# Patient Record
Sex: Female | Born: 1975 | Race: White | Hispanic: No | Marital: Married | Smoking: Never smoker
Health system: Southern US, Community
[De-identification: ages and names within clinical notes are randomized; demographics above are authoritative.]

## PROBLEM LIST (undated history)

## (undated) DIAGNOSIS — D1803 Hemangioma of intra-abdominal structures: Secondary | ICD-10-CM

## (undated) DIAGNOSIS — R5383 Other fatigue: Secondary | ICD-10-CM

## (undated) DIAGNOSIS — T7840XA Allergy, unspecified, initial encounter: Secondary | ICD-10-CM

## (undated) DIAGNOSIS — R0683 Snoring: Secondary | ICD-10-CM

## (undated) DIAGNOSIS — R400 Somnolence: Secondary | ICD-10-CM

## (undated) DIAGNOSIS — K219 Gastro-esophageal reflux disease without esophagitis: Secondary | ICD-10-CM

## (undated) HISTORY — DX: Somnolence: R40.0

## (undated) HISTORY — PX: CHOLECYSTECTOMY: SHX55

## (undated) HISTORY — PX: LAMINECTOMY AND MICRODISCECTOMY LUMBAR SPINE: SHX1913

## (undated) HISTORY — DX: Other fatigue: R53.83

## (undated) HISTORY — DX: Snoring: R06.83

## (undated) HISTORY — DX: Gastro-esophageal reflux disease without esophagitis: K21.9

## (undated) HISTORY — PX: ABDOMINAL HYSTERECTOMY: SHX81

## (undated) HISTORY — DX: Allergy, unspecified, initial encounter: T78.40XA

## (undated) HISTORY — DX: Hemangioma of intra-abdominal structures: D18.03

## (undated) SURGERY — Surgical Case
Anesthesia: *Unknown

---

## 1999-07-19 ENCOUNTER — Emergency Department (HOSPITAL_COMMUNITY): Admission: EM | Admit: 1999-07-19 | Discharge: 1999-07-19 | Payer: Self-pay | Admitting: Emergency Medicine

## 1999-09-15 ENCOUNTER — Encounter: Payer: Self-pay | Admitting: Internal Medicine

## 1999-09-15 ENCOUNTER — Emergency Department (HOSPITAL_COMMUNITY): Admission: EM | Admit: 1999-09-15 | Discharge: 1999-09-15 | Payer: Self-pay | Admitting: Internal Medicine

## 2000-01-26 ENCOUNTER — Emergency Department (HOSPITAL_COMMUNITY): Admission: EM | Admit: 2000-01-26 | Discharge: 2000-01-26 | Payer: Self-pay | Admitting: Emergency Medicine

## 2000-01-26 ENCOUNTER — Encounter: Payer: Self-pay | Admitting: Emergency Medicine

## 2002-01-25 ENCOUNTER — Emergency Department (HOSPITAL_COMMUNITY): Admission: EM | Admit: 2002-01-25 | Discharge: 2002-01-25 | Payer: Self-pay | Admitting: Emergency Medicine

## 2003-01-31 ENCOUNTER — Ambulatory Visit (HOSPITAL_COMMUNITY): Admission: RE | Admit: 2003-01-31 | Discharge: 2003-01-31 | Payer: Self-pay | Admitting: Gastroenterology

## 2003-02-20 ENCOUNTER — Encounter: Admission: RE | Admit: 2003-02-20 | Discharge: 2003-02-20 | Payer: Self-pay | Admitting: Gastroenterology

## 2003-02-20 ENCOUNTER — Encounter: Payer: Self-pay | Admitting: Gastroenterology

## 2003-02-25 ENCOUNTER — Emergency Department (HOSPITAL_COMMUNITY): Admission: EM | Admit: 2003-02-25 | Discharge: 2003-02-25 | Payer: Self-pay

## 2003-02-25 ENCOUNTER — Encounter: Payer: Self-pay | Admitting: Family Medicine

## 2003-03-16 ENCOUNTER — Encounter (INDEPENDENT_AMBULATORY_CARE_PROVIDER_SITE_OTHER): Payer: Self-pay | Admitting: Specialist

## 2003-03-16 ENCOUNTER — Ambulatory Visit (HOSPITAL_COMMUNITY): Admission: RE | Admit: 2003-03-16 | Discharge: 2003-03-16 | Payer: Self-pay | Admitting: Surgery

## 2003-07-09 ENCOUNTER — Other Ambulatory Visit: Admission: RE | Admit: 2003-07-09 | Discharge: 2003-07-09 | Payer: Self-pay | Admitting: Obstetrics & Gynecology

## 2003-11-07 ENCOUNTER — Encounter: Admission: RE | Admit: 2003-11-07 | Discharge: 2003-11-07 | Payer: Self-pay | Admitting: Emergency Medicine

## 2003-12-14 ENCOUNTER — Encounter: Admission: RE | Admit: 2003-12-14 | Discharge: 2003-12-14 | Payer: Self-pay | Admitting: Emergency Medicine

## 2004-03-28 ENCOUNTER — Encounter: Admission: RE | Admit: 2004-03-28 | Discharge: 2004-03-28 | Payer: Self-pay | Admitting: Emergency Medicine

## 2004-07-06 ENCOUNTER — Emergency Department (HOSPITAL_COMMUNITY): Admission: EM | Admit: 2004-07-06 | Discharge: 2004-07-06 | Payer: Self-pay | Admitting: Family Medicine

## 2004-08-05 ENCOUNTER — Other Ambulatory Visit: Admission: RE | Admit: 2004-08-05 | Discharge: 2004-08-05 | Payer: Self-pay | Admitting: Obstetrics & Gynecology

## 2005-05-10 ENCOUNTER — Inpatient Hospital Stay (HOSPITAL_COMMUNITY): Admission: AD | Admit: 2005-05-10 | Discharge: 2005-05-13 | Payer: Self-pay | Admitting: Obstetrics & Gynecology

## 2005-05-10 ENCOUNTER — Inpatient Hospital Stay (HOSPITAL_COMMUNITY): Admission: AD | Admit: 2005-05-10 | Discharge: 2005-05-10 | Payer: Self-pay | Admitting: Obstetrics and Gynecology

## 2005-06-16 ENCOUNTER — Other Ambulatory Visit: Admission: RE | Admit: 2005-06-16 | Discharge: 2005-06-16 | Payer: Self-pay | Admitting: Obstetrics & Gynecology

## 2006-05-30 ENCOUNTER — Emergency Department (HOSPITAL_COMMUNITY): Admission: EM | Admit: 2006-05-30 | Discharge: 2006-05-30 | Payer: Self-pay | Admitting: Emergency Medicine

## 2006-05-31 ENCOUNTER — Encounter: Admission: RE | Admit: 2006-05-31 | Discharge: 2006-05-31 | Payer: Self-pay | Admitting: Emergency Medicine

## 2006-06-24 ENCOUNTER — Ambulatory Visit (HOSPITAL_COMMUNITY): Admission: RE | Admit: 2006-06-24 | Discharge: 2006-06-25 | Payer: Self-pay | Admitting: Specialist

## 2006-09-07 ENCOUNTER — Encounter: Admission: RE | Admit: 2006-09-07 | Discharge: 2006-09-07 | Payer: Self-pay | Admitting: Emergency Medicine

## 2006-09-13 ENCOUNTER — Encounter: Admission: RE | Admit: 2006-09-13 | Discharge: 2006-09-13 | Payer: Self-pay | Admitting: Emergency Medicine

## 2007-07-22 ENCOUNTER — Ambulatory Visit (HOSPITAL_COMMUNITY): Admission: RE | Admit: 2007-07-22 | Discharge: 2007-07-22 | Payer: Self-pay | Admitting: Obstetrics and Gynecology

## 2007-10-04 ENCOUNTER — Encounter: Admission: RE | Admit: 2007-10-04 | Discharge: 2007-10-04 | Payer: Self-pay | Admitting: Emergency Medicine

## 2007-11-22 ENCOUNTER — Encounter (INDEPENDENT_AMBULATORY_CARE_PROVIDER_SITE_OTHER): Payer: Self-pay | Admitting: Obstetrics & Gynecology

## 2007-11-22 ENCOUNTER — Ambulatory Visit (HOSPITAL_COMMUNITY): Admission: RE | Admit: 2007-11-22 | Discharge: 2007-11-23 | Payer: Self-pay | Admitting: Obstetrics & Gynecology

## 2008-06-13 ENCOUNTER — Encounter: Admission: RE | Admit: 2008-06-13 | Discharge: 2008-06-13 | Payer: Self-pay | Admitting: Obstetrics & Gynecology

## 2009-03-22 ENCOUNTER — Encounter: Admission: RE | Admit: 2009-03-22 | Discharge: 2009-03-22 | Payer: Self-pay | Admitting: Internal Medicine

## 2009-04-30 ENCOUNTER — Encounter: Admission: RE | Admit: 2009-04-30 | Discharge: 2009-04-30 | Payer: Self-pay | Admitting: Internal Medicine

## 2010-12-21 ENCOUNTER — Encounter: Payer: Self-pay | Admitting: Emergency Medicine

## 2010-12-24 ENCOUNTER — Emergency Department (HOSPITAL_BASED_OUTPATIENT_CLINIC_OR_DEPARTMENT_OTHER)
Admission: EM | Admit: 2010-12-24 | Discharge: 2010-12-24 | Payer: Self-pay | Source: Home / Self Care | Admitting: Emergency Medicine

## 2010-12-24 LAB — COMPREHENSIVE METABOLIC PANEL
ALT: 18 U/L (ref 0–35)
Alkaline Phosphatase: 78 U/L (ref 39–117)
BUN: 11 mg/dL (ref 6–23)
CO2: 24 mEq/L (ref 19–32)
Chloride: 109 mEq/L (ref 96–112)
GFR calc non Af Amer: >60 mL/min (ref >60)
Glucose, Bld: 102 mg/dL — ABNORMAL HIGH (ref 70–99)
Potassium: 3.9 mEq/L (ref 3.5–5.1)
Sodium: 144 mEq/L (ref 135–145)
Total Bilirubin: 0.6 mg/dL (ref 0.3–1.2)
Total Protein: 7.6 g/dL (ref 6.0–8.3)

## 2010-12-24 LAB — CBC
HCT: 35 % — ABNORMAL LOW (ref 36.0–46.0)
Hemoglobin: 11.4 g/dL — ABNORMAL LOW (ref 12.0–15.0)
MCV: 76.9 fL — ABNORMAL LOW (ref 78.0–100.0)
RBC: 4.55 MIL/uL (ref 3.87–5.11)
WBC: 9.6 10*3/uL (ref 4.0–10.5)

## 2010-12-24 LAB — POCT CARDIAC MARKERS
CKMB, poc: <1 ng/mL — ABNORMAL LOW (ref 1.0–8.0)
Troponin i, poc: 0.07 ng/mL (ref 0.00–0.09)
Troponin i, poc: <0.05 ng/mL (ref 0.00–0.09)

## 2010-12-24 LAB — URINALYSIS, ROUTINE W REFLEX MICROSCOPIC
Bilirubin Urine: NEGATIVE
Nitrite: NEGATIVE
Specific Gravity, Urine: 1.024 (ref 1.005–1.030)
Urobilinogen, UA: 0.2 mg/dL (ref 0.0–1.0)

## 2010-12-24 LAB — DIFFERENTIAL
Basophils Absolute: 0 10*3/uL (ref 0.0–0.1)
Lymphocytes Relative: 26 % (ref 12–46)
Lymphs Abs: 2.5 10*3/uL (ref 0.7–4.0)
Neutro Abs: 6.1 10*3/uL (ref 1.7–7.7)

## 2010-12-24 LAB — D-DIMER, QUANTITATIVE: D-Dimer, Quant: 0.27 ug/mL-FEU (ref 0.00–0.48)

## 2011-04-14 NOTE — H&P (Signed)
[name], [name]             ACCOUNT NO.:  [id number]   MEDICAL RECORD NO.:  [id number]          PATIENT TYPE:  AMB   LOCATION:  SDC                           FACILITY:  [facility]   PHYSICIAN:  Freddy Finner, M.D.   DATE OF BIRTH:  1975/12/05   DATE OF ADMISSION:  11/22/2007  DATE OF DISCHARGE:                              HISTORY & PHYSICAL   ADMITTING DIAGNOSIS:  Persistent dysfunctional uterine bleeding.   The patient is a 35 year old, white, married female with one living  child who has a very long history of irregular menses.  She conceived  with Clomid with her only term delivery.  She does not wish to have more  children.  Various attempts have been made to control her irregular  uterine bleeding including oral contraceptives but she is known to have  a liver adenoma and there was documented increased size of the adenoma  on oral contraceptives which did not fully resolve the problem anyway.  In the recent past she had placement of a progestin-eluting intrauterine  device.  This too failed to produce adequate control of bleeding and  produced adverse side effects including mood change and decreased  libido.  Other options have been discussed with her including  endometrial ablation.  She has requested definitive surgery and is  admitted now for a laparoscopically-assisted vaginal hysterectomy.  She  has reviewed a video in the office which reviews carefully the technique  of the procedure including the risk of infection, hemorrhage, and injury  to other organs.  She has indicated a willingness to proceed with  surgery.   CURRENT REVIEW OF SYSTEMS:  Otherwise negative.  There are no cardiac,  pulmonary, GI, or GU complaints.   PAST MEDICAL HISTORY:  The patient has no known significant medical  illnesses.  She had an abnormal Pap many years ago but followup has been  normal.   She is currently on no medications on a regular basis.   __________  .   She has had no other  surgical procedures.   FAMILY HISTORY:  Noncontributory.   PHYSICAL EXAMINATION:  HEENT:  Grossly within normal limits.  Thyroid  gland is not palpably enlarged.  VITAL SIGNS:  Blood pressure in the office 100/70.  CHEST:  Clear to auscultation throughout.  HEART:  Normal sinus rhythm without murmurs, rubs, or gallops.  ABDOMEN:  Soft and nontender without appreciable organomegaly or  palpable masses.  BREAST:  Normal.  No palpable masses.  No nipple discharge.  No skin  change.  EXTREMITIES:  Without cyanosis, clubbing, or edema.  PELVIC:  External genitalia, vagina, and cervix are normal.  Bimanual  reveals the uterus to be possibly slightly enlarged.  There are no  palpable adnexal masses.  The rectum is palpably normal.  Rectovaginal  exam confirms the above findings.   ASSESSMENT:  1. Persistent dysfunctional uterine bleeding.  2. History of abnormal Pap smear.  3. Unresponsive to other hormonal and intrauterine manipulations for      the bleeding.   PLAN:  Laparoscopically-assisted vaginal hysterectomy.      Freddy Finner, M.D.  Electronically Signed     WRN/MEDQ  D:  11/21/2007  T:  11/21/2007  Job:  [id number]

## 2011-04-14 NOTE — Op Note (Signed)
NAMESHEMIKA, [name]             ACCOUNT NO.:  [id number]   MEDICAL RECORD NO.:  [id number]          PATIENT TYPE:  OIB   LOCATION:  9304                          FACILITY:  [facility]   PHYSICIAN:  Freddy Finner, M.D.   DATE OF BIRTH:  08/20/1976   DATE OF PROCEDURE:  11/22/2007  DATE OF DISCHARGE:                               OPERATIVE REPORT   PREOPERATIVE DIAGNOSIS:  Persistent dysfunctional uterine bleeding.   POSTOPERATIVE DIAGNOSIS:  Persistent dysfunctional uterine bleeding,  plus minimal evidence of pelvic endometriosis.   OPERATIVE PROCEDURE:  Laparoscopic-assisted vaginal hysterectomy,  fulguration of endometriotic implant of bladder, peritoneum.   SURGEON:  Freddy Finner, M.D.   ASSISTANT:  __________   ANESTHESIA:  General endotracheal.   ESTIMATED INTRAOPERATIVE BLOOD LOSS:  100 mL.   INTRAOPERATIVE COMPLICATIONS:  None.   The patient was admitted on the morning of surgery.  She was given a  gram of Ancef.  She was placed in PAS hose.  She was brought to the  operating room and there placed under adequate general endotracheal  anesthesia, placed in dorsal lithotomy position using Allen's stirrups  system.  Betadine prep was carried out using scrub followed by solution  of the abdomen, perineum and vagina.  Bladder was evacuated with sterile  technique.  Hulka tenaculum was attached to the cervix under direct  visualization.  Sterile drapes were applied.  Two small incisions were  made, one at the umbilicus and one just above the symphysis.  Through  the upper incision 11 mm blade disposable trocar was introduced while  elevating anterior abdominal wall manually.  Direct inspection revealed  adequate placement with no evidence of injury on entry.  Pneumoperitoneum was allowed to accumulate with carbon dioxide gas.  5  mm trocar was placed in a small incision just above the symphysis.  A  blunt probe was used and later then Nezhat irrigation system.  Examination  of upper abdomen revealed no apparent abnormalities.  Appendix was visualized and was normal.  The uterus itself was slightly  enlarged overall to gross inspection.  There was a single endometriotic  implant on the peritoneum overlying the bladder anteriorly.  There were  no other visible lesions of endometriosis.  Both tubes and ovaries were  normal.  There was release of corpus luteum of left ovary.  Using the  gyrus tripolar device the endometriotic lesion was fulgurated and  progressive pedicles were developed including the fallopian tube, the  utero-ovarian pedicle, the round ligament and the upper broad ligament  on each side.  Each pedicle was sealed and divided sharply with a  tripolar device.  The dissection was carried down to a level just above  the uterine arteries on each side.  Attention was then turned vaginally.  A posterior weighted vaginal retractor was placed.  Hulka tenaculum was  replaced with a Jacobs tenaculum.  Deaver retractors were used  anteriorly and laterally for exposure.  Colpotomy incision was made  while tenting mucosa posterior to the cervix.  Bonanno retractor was  then placed posteriorly.  The mucosa was released from the cervix and  with a scalpel in a circumferential incision.  The Ligasure device was  then used to seal and divide the uterosacral pedicles and bladder  pillars.  The bladder was further advanced off the cervix and lower  segment.  Cardinal ligament pedicles were taken, sealed and divided.  Anterior peritoneum was then entered.  Methylene blue was given IV to  confirm the absence of bladder injury.  The uterine artery pedicles were  then sealed and divided with the Gyrus.  Uterus was delivered through  the vaginal introitus.  Additional small peritoneal pedicles were sealed  and divided with the Ligasure. The angles of the vagina were then  anchored to uterosacrals with mattress sutures of 0 Monocryl.  Posterior  peritoneum was closed and  uterosacrals plicated with interrupted 0-0  Monocryl suture.  Cuff was closed vertically with figure-of-eights of 0-  0 Monocryl.  Foley catheter was placed.  Reinspection was then carried  out laparoscopically using the Nezhat system with lactated Ringer's  solution.  Copious irrigation of the pelvis was carried out.  Some  minimal oozing spots were noted along the bladder dissection and these  were easily controlled with bipolar cautery.  Hemostasis was confirmed  to be complete under reduced intra-abdominal pressure and irrigating  solution.  Irrigating solution was aspirated from the abdomen, gas was  allowed to escape.  All pack needle instrument counts were correct.  Abdominal systems were closed with interrupted subcuticular sutures of 3-  0 Dexon.  Steri-Strips were applied to lower incision.  Sterile dressing  to the upper incision.  Photographs were made before and after the  hysterectomy and are retained in the office record as well as a copy was  given to the patient's husband for her review.  The patient was  awakened, taken to recovery in good condition.      Freddy Finner, M.D.  Electronically Signed     WRN/MEDQ  D:  11/22/2007  T:  11/22/2007  Job:  [id number]

## 2011-04-17 NOTE — Op Note (Signed)
[name], [name]             ACCOUNT NO.:  [id number]   MEDICAL RECORD NO.:  [id number]          PATIENT TYPE:  AMB   LOCATION:  DAY                          FACILITY:  [facility]   PHYSICIAN:  Jene Every, M.D.    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   PROCEDURE PERFORMED:  L3-4 microdiskectomy utilizing extraforaminal  approach.   ANESTHESIA:  General.   ASSISTANT:  Kerrin Champagne, M.D.   BRIEF HISTORY AND INDICATIONS:  This is a 35 year old female with left lower  extremity radicular pain secondary to foraminal HNP at L3.  She had numbness  in the L3 nerve root distribution, quadriceps weakness, diminished knee  reflex.  Positive neurotension sign.  Prior conservative treatment including  selective nerve root block.  Due to a large HNP compressing the 3 root, we  felt decompression was appropriate.  Given the disc position, it was felt  that at 3-4 utilizing the standard interlaminar approach we would be able to  find the disc herniation.  We therefore felt an extraforaminal approach  would be best appropriate.  The risks and benefits were discussed including  bleeding, infection, damage to nerve and vascular structures, CSF leak,  __________ fibrosis, adjacent segment disease, need for repeat procedure in  the procedure, anesthetic complications, etc.   TECHNIQUE:  With the patient in the supine position after induction of  general anesthesia and 1 gram of Kefzol, she was placed prone on the  __________ frame.  All bony prominences were well-padded and the lumbar  region was prepped and draped in the usual sterile fashion.  Two 18-gauge  spinal needles were utilized to localize the 3-4 interspace, confirmed  with  x-ray.  Incision was made off the midline approximately one fingerbreadth to  the left through the skin only 3 cm in length.  Subcutaneous tissue was  dissected and electrocautery was utilized to achieve hemostasis.  The dorsal  lumbar fascia was divided in line with the skin incision.  The paraspinous  musculature was elevated from the lateral aspect of the facet.  We  identified the 3 and 4 transverse processes, placed a McCullough retractor  and a Penfield at the space which confirmed the x-ray to be 3 -4.  The  operating microscope was draped and brought into the surgical field.  We  first attached the inner transverse ligament from the cephalad edge of 3  utilizing straight curet and a 2 mm Kerrison to perform partial removal of  the transverse process of 4.  We then removed the inner transverse membrane.  Noted immediately beneath that was the nerve root displaced out laterally  and posteriorly with a significantly edematous dorsal root ganglion.  We  examined the outside of the ganglion.  The root was not mobile.  We released  soft tissue medially.  Removed a portion of the outer aspect of  the facet  for better visualization of the triangle beneath the nerve root and on top  of the __________.  We noticed extruded fragment that was removed with micro  pituitaries.  This greatly mobilized the nerve root.  We entered the disc  space, moving medially just beneath the posterior longitudinal ligament at  no time directed anteriorly.  We used a hockey stick probe and a Penfield  and identified the disc space and mobilized the foraminal component of the  disc herniation into the disc space which was then retrieved with an up  biting pituitary.  There was some spurring of the end plates noted.  We then  mobilized the nerve root medially and looked at the outside on the nerve  root cephalad.  Two at the ganglion was noted.  We looked beneath the disc.  We retracted the root gently for a  short period of time medially and removed  a portion of the disc from the other side of the nerve root without  difficulty.  Hockey stick probe then placed freely in the foramen on top of  and below the nerve root.  There was good excursion root at a centimeter in  either direction without difficulty.  No evidence of CSF leakage.  Bipolar  cautery had been utilized to achieve hemostasis.  We placed thrombin-soaked  Gelfoam just below the root.  Following this, we let the fatty tissue and  the paraspinous muscles relax over top of the root.  No active bleeding was  noted.  We then released the [facility] retractor and repaired the fascia  with 0 Vicryl simple sutures.  Subcutaneous tissues were approximated with 2-  0 Vicryl subcuticular.  Skin was reapproximated with 4-0 subcuticular  Prolene.  The wound was reinforced with Steri-Strips.  Sterile dressing  applied.  Placed supine on hospital bed and extubated without difficulty and  transported to the recovery room in satisfactory condition.  The patient  tolerated the procedure well.  No complications.  Minimal blood loss.      Jene Every, M.D.  Electronically Signed     JB/MEDQ  D:  06/24/2006  T:  06/24/2006  Job:  [id number]   cc:   Kerrin Champagne, M.D.  Fax: [phone]

## 2011-04-17 NOTE — Op Note (Signed)
NAME:  [name], [name]                     ACCOUNT NO.:  [id number]   MEDICAL RECORD NO.:  [id number]                   PATIENT TYPE:  OIB   LOCATION:  2550                                 FACILITY:  [facility]   PHYSICIAN:  Abigail Miyamoto, M.D.              DATE OF BIRTH:  Jan 06, 1976   DATE OF PROCEDURE:  03/16/2003  DATE OF DISCHARGE:                                 OPERATIVE REPORT   PREOPERATIVE DIAGNOSIS:  Biliary dyskinesia.   POSTOPERATIVE DIAGNOSIS:  Biliary dyskinesia.   OPERATION PERFORMED:  Laparoscopic cholecystectomy with intraoperative  cholangiogram.   SURGEON:  Douglas A. Magnus Ivan, M.D.   ASSISTANT:  Magnus Ivan, RN   ANESTHESIA:   ESTIMATED BLOOD LOSS:  Minimal.   OPERATIVE FINDINGS:  The patient was found to have a normal cholangiogram.   DESCRIPTION OF PROCEDURE:  The patient was brought to the operating room and  identified.  She was placed supine on the operating table and anesthesia was  induced.  Her abdomen was then prepped and draped in the usual sterile  fashion.  Using a #15 blade a small transverse incision was made below the  umbilicus.  The incision was carried down to the fascia which was then  opened with a scalpel.  Hemostat was then used to pass into the peritoneal  cavity.  Next, a 0 Vicryl pursestring suture was placed around the fascial  opening.  The Hasson port was placed through the opening and insufflation of  the abdomen was begun.  A 12 mm port was then placed in the patient's  epigastrium and two 5 mm ports placed in the patient's right flank under  direct vision.  The gallbladder was then identified and retracted above the  liver bed.  The cystic duct was then dissected out.  It was then clipped  twice distally and then partly transected with the scissors.  An  Angiocatheter was then inserted in the right upper quadrant under direct  vision.  A cholangiocatheter was placed through the Angiocatheter and pushed  into the  cystic duct under direct vision.  A cholangiogram was then  performed under direct fluoroscopy.  Good flow of contrast was seen into the  entire biliary system and duodenum without evidence of abnormality.  The  cholangiocatheter was then removed.  The cystic duct was clipped three times  proximally and transected with scissors.  The cystic artery and a branch  were then identified and clipped twice proximally and once distally and  transected as well.  The gallbladder was then easily dissected free from the  liver bed with the electrocautery.  Once the gallbladder was free from the  liver bed, the liver bed was examined.  Hemostasis was felt to be achieved.  The gallbladder was then removed through the incision at the umbilicus.  The  0 Vicryl at the umbilicus was tied in placed, placed in the fascial defect.  The abdomen was then irrigated with  normal saline.  Again, hemostasis  appeared to be achieved.  All ports were then removed under direct vision  and the abdomen was deflated.  All incisions were then anesthetized with  0.25% Marcaine and closed with 4-0 Vicryl subcuticular sutures.  Steri-  Strips, gauze and tape were then applied.  The patient tolerated the  procedure well.  All sponge, needle and instrument counts were correct at  the end of the procedure.  The patient was then extubated in the operating  room and taken in stable condition to the recovery room.                                                   Abigail Miyamoto, M.D.    DB/MEDQ  D:  03/16/2003  T:  03/16/2003  Job:  [id number]   cc:   Anselmo Rod, M.D.  [address]  [address]  [place]  [place] [address]  Fax: [phone]

## 2011-04-17 NOTE — Op Note (Signed)
   [name], [name]                       ACCOUNT NO.:  [id number]   MEDICAL RECORD NO.:  [id number]                   PATIENT TYPE:  AMB   LOCATION:  ENDO                                 FACILITY:  [facility]   PHYSICIAN:  Anselmo Rod, M.D.               DATE OF BIRTH:  1976-05-22   DATE OF PROCEDURE:  01/31/2003  DATE OF DISCHARGE:  01/31/2003                                 OPERATIVE REPORT   PROCEDURE:  Esophagogastroduodenoscopy.   ENDOSCOPIST:  Anselmo Rod, M.D.   INSTRUMENT USED:  Olympus video panendoscope.   INDICATION FOR PROCEDURE:  A 35 year old white female undergoing an EGD  because of epigastric pain not responding to PPIs.  Rule out ulcer disease,  esophagitis, etc.   PREPROCEDURE PREPARATION:  Informed consent was procured from the patient.  The patient was fasted for eight hours prior to the procedure.   PREPROCEDURE PHYSICAL:  VITAL SIGNS:  The patient had stable vital signs.  NECK:  Supple.  CHEST:  Clear to auscultation.  S1, S2 regular, no murmur, rub, or gallop.  ABDOMEN:  Soft with normal bowel sounds.   DESCRIPTION OF PROCEDURE:  The patient was placed in the left lateral  decubitus position and sedated with 70 mg of Demerol and 7 mg of Versed  intravenously.  Once the patient was adequately sedate and maintained on low-  flow oxygen and continuous cardiac monitoring, the Olympus video  panendoscope was advanced through the mouthpiece, over the tongue, into the  esophagus under direct vision.  The entire esophagus appeared normal with no  evidence of ring, stricture, masses, esophagitis, or Barrett's mucosa.  The  scope was then advanced into the stomach.  The entire gastric mucosa and the  proximal bowel seemed normal as well.  No masses, polyps, erosions, or  ulcerations were seen.  Retroflexion in the high cardia revealed no  abnormalities.   IMPRESSION:  Normal EGD.   RECOMMENDATIONS:  1. Continue PPIs for now.  2. Avoid  nonsteroidals, including OTC aspirin.  3. Gallbladder ultrasound and HIDA scan have been planned.  4. Outpatient follow-up thereafter.                                               Anselmo Rod, M.D.   JNM/MEDQ  D:  02/03/2003  T:  02/04/2003  Job:  [id number]   cc:   Bernadette Hoit, MD, [facility], [place]

## 2011-09-04 LAB — URINALYSIS, ROUTINE W REFLEX MICROSCOPIC
Glucose, UA: NEGATIVE
Leukocytes, UA: NEGATIVE
Nitrite: NEGATIVE
Protein, ur: NEGATIVE
Urobilinogen, UA: 0.2

## 2011-09-04 LAB — CBC
MCHC: 33.8
Platelets: 237
RBC: 5.07
RDW: 13.1
RDW: 13.2

## 2011-09-04 LAB — PROTIME-INR
INR: 0.9
Prothrombin Time: 12.7

## 2011-09-04 LAB — URINE MICROSCOPIC-ADD ON

## 2011-11-27 ENCOUNTER — Ambulatory Visit: Payer: Self-pay

## 2013-01-01 ENCOUNTER — Ambulatory Visit (INDEPENDENT_AMBULATORY_CARE_PROVIDER_SITE_OTHER): Payer: 59 | Admitting: Family Medicine

## 2013-01-01 VITALS — BP 114/72 | HR 101 | Temp 100.0°F | Resp 18 | Ht 68.25 in | Wt 199.0 lb

## 2013-01-01 DIAGNOSIS — R05 Cough: Secondary | ICD-10-CM

## 2013-01-01 DIAGNOSIS — J111 Influenza due to unidentified influenza virus with other respiratory manifestations: Secondary | ICD-10-CM

## 2013-01-01 DIAGNOSIS — R059 Cough, unspecified: Secondary | ICD-10-CM

## 2013-01-01 DIAGNOSIS — J019 Acute sinusitis, unspecified: Secondary | ICD-10-CM

## 2013-01-01 DIAGNOSIS — R6889 Other general symptoms and signs: Secondary | ICD-10-CM

## 2013-01-01 MED ORDER — AMOXICILLIN-POT CLAVULANATE 875-125 MG PO TABS: 1.0000 | ORAL_TABLET | Freq: Two times a day (BID) | ORAL | Status: DC

## 2013-01-01 MED ORDER — HYDROCODONE-HOMATROPINE 5-1.5 MG/5ML PO SYRP: 5.0000 mL | ORAL_SOLUTION | Freq: Every evening | ORAL | Status: DC | PRN

## 2013-01-01 MED ORDER — ALBUTEROL SULFATE HFA 108 (90 BASE) MCG/ACT IN AERS: 2.0000 | INHALATION_SPRAY | Freq: Four times a day (QID) | RESPIRATORY_TRACT | Status: DC | PRN

## 2013-01-01 MED ORDER — IPRATROPIUM BROMIDE 0.03 % NA SOLN: 2.0000 | Freq: Two times a day (BID) | NASAL | Status: DC

## 2013-01-01 MED ORDER — BENZONATATE 100 MG PO CAPS: 200.0000 mg | ORAL_CAPSULE | Freq: Two times a day (BID) | ORAL | Status: AC | PRN

## 2013-01-01 NOTE — Patient Instructions (Signed)
Influenza Facts Flu (influenza) is a contagious respiratory illness caused by the influenza viruses. It can cause mild to severe illness. While most healthy people recover from the flu without specific treatment and without complications, older people, young children, and people with certain health conditions are at higher risk for serious complications from the flu, including death. CAUSES   The flu virus is spread from person to person by respiratory droplets from coughing and sneezing.  A person can also become infected by touching an object or surface with a virus on it and then touching their mouth, eye or nose.  Adults may be able to infect others from 1 day before symptoms occur and up to 7 days after getting sick. So it is possible to give someone the flu even before you know you are sick and continue to infect others while you are sick. SYMPTOMS   Fever (usually high).  Headache.  Tiredness (can be extreme).  Cough.  Sore throat.  Runny or stuffy nose.  Body aches.  Diarrhea and vomiting may also occur, particularly in children.  These symptoms are referred to as "flu-like symptoms". A lot of different illnesses, including the common cold, can have similar symptoms. DIAGNOSIS   There are tests that can determine if you have the flu as long you are tested within the first 2 or 3 days of illness.  A doctor's exam and additional tests may be needed to identify if you have a disease that is a complicating the flu. RISKS AND COMPLICATIONS  Some of the complications caused by the flu include:  Bacterial pneumonia or progressive pneumonia caused by the flu virus.  Loss of body fluids (dehydration).  Worsening of chronic medical conditions, such as heart failure, asthma, or diabetes.  Sinus problems and ear infections. HOME CARE INSTRUCTIONS   Seek medical care early on.  If you are at high risk from complications of the flu, consult your health-care provider as soon  as you develop flu-like symptoms. Those at high risk for complications include:  People 65 years or older.  People with chronic medical conditions, including diabetes.  Pregnant women.  Young children.  Your caregiver may recommend use of an antiviral medication to help treat the flu.  If you get the flu, get plenty of rest, drink a lot of liquids, and avoid using alcohol and tobacco.  You can take over-the-counter medications to relieve the symptoms of the flu if your caregiver approves. (Never give aspirin to children or teenagers who have flu-like symptoms, particularly fever). PREVENTION  The single best way to prevent the flu is to get a flu vaccine each fall. Other measures that can help protect against the flu are:  Antiviral Medications  A number of antiviral drugs are approved for use in preventing the flu. These are prescription medications, and a doctor should be consulted before they are used.  Habits for Good Health  Cover your nose and mouth with a tissue when you cough or sneeze, throw the tissue away after you use it.  Wash your hands often with soap and water, especially after you cough or sneeze. If you are not near water, use an alcohol-based hand cleaner.  Avoid people who are sick.  If you get the flu, stay home from work or school. Avoid contact with other people so that you do not make them sick, too.  Try not to touch your eyes, nose, or mouth as germs ore often spread this way. IN CHILDREN, EMERGENCY WARNING SIGNS  THAT NEED URGENT MEDICAL ATTENTION:  Fast breathing or trouble breathing.  Bluish skin color.  Not drinking enough fluids.  Not waking up or not interacting.  Being so irritable that the child does not want to be held.  Flu-like symptoms improve but then return with fever and worse cough.  Fever with a rash. IN ADULTS, EMERGENCY WARNING SIGNS THAT NEED URGENT MEDICAL ATTENTION:  Difficulty breathing or shortness of breath.  Pain  or pressure in the chest or abdomen.  Sudden dizziness.  Confusion.  Severe or persistent vomiting. SEEK IMMEDIATE MEDICAL CARE IF:  You or someone you know is experiencing any of the symptoms above. When you arrive at the emergency center,report that you think you have the flu. You may be asked to wear a mask and/or sit in a secluded area to protect others from getting sick. MAKE SURE YOU:   Understand these instructions.  Monitor your condition.  Seek medical care if you are getting worse, or not improving. Document Released: 11/19/2003 Document Revised: 02/08/2012 Document Reviewed: 08/15/2009 [facility] Patient Information 2013 [place], [place]. Sinusitis Sinusitis is redness, soreness, and swelling (inflammation) of the paranasal sinuses. Paranasal sinuses are air pockets within the bones of your face (beneath the eyes, the middle of the forehead, or above the eyes). In healthy paranasal sinuses, mucus is able to drain out, and air is able to circulate through them by way of your nose. However, when your paranasal sinuses are inflamed, mucus and air can become trapped. This can allow bacteria and other germs to grow and cause infection. Sinusitis can develop quickly and last only a short time (acute) or continue over a long period (chronic). Sinusitis that lasts for more than 12 weeks is considered chronic.  CAUSES  Causes of sinusitis include:  Allergies.  Structural abnormalities, such as displacement of the cartilage that separates your nostrils (deviated septum), which can decrease the air flow through your nose and sinuses and affect sinus drainage.  Functional abnormalities, such as when the small hairs (cilia) that line your sinuses and help remove mucus do not work properly or are not present. SYMPTOMS  Symptoms of acute and chronic sinusitis are the same. The primary symptoms are pain and pressure around the affected sinuses. Other symptoms include:  Upper  toothache.  Earache.  Headache.  Bad breath.  Decreased sense of smell and taste.  A cough, which worsens when you are lying flat.  Fatigue.  Fever.  Thick drainage from your nose, which often is green and may contain pus (purulent).  Swelling and warmth over the affected sinuses. DIAGNOSIS  Your caregiver will perform a physical exam. During the exam, your caregiver may:  Look in your nose for signs of abnormal growths in your nostrils (nasal polyps).  Tap over the affected sinus to check for signs of infection.  View the inside of your sinuses (endoscopy) with a special imaging device with a light attached (endoscope), which is inserted into your sinuses. If your caregiver suspects that you have chronic sinusitis, one or more of the following tests may be recommended:  Allergy tests.  Nasal culture A sample of mucus is taken from your nose and sent to a lab and screened for bacteria.  Nasal cytology A sample of mucus is taken from your nose and examined by your caregiver to determine if your sinusitis is related to an allergy. TREATMENT  Most cases of acute sinusitis are related to a viral infection and will resolve on their own within 10 days.  Sometimes medicines are prescribed to help relieve symptoms (pain medicine, decongestants, nasal steroid sprays, or saline sprays).  However, for sinusitis related to a bacterial infection, your caregiver will prescribe antibiotic medicines. These are medicines that will help kill the bacteria causing the infection.  Rarely, sinusitis is caused by a fungal infection. In theses cases, your caregiver will prescribe antifungal medicine. For some cases of chronic sinusitis, surgery is needed. Generally, these are cases in which sinusitis recurs more than 3 times per year, despite other treatments. HOME CARE INSTRUCTIONS   Drink plenty of water. Water helps thin the mucus so your sinuses can drain more easily.  Use a  humidifier.  Inhale steam 3 to 4 times a day (for example, sit in the bathroom with the shower running).  Apply a warm, moist washcloth to your face 3 to 4 times a day, or as directed by your caregiver.  Use saline nasal sprays to help moisten and clean your sinuses.  Take over-the-counter or prescription medicines for pain, discomfort, or fever only as directed by your caregiver. SEEK IMMEDIATE MEDICAL CARE IF:  You have increasing pain or severe headaches.  You have nausea, vomiting, or drowsiness.  You have swelling around your face.  You have vision problems.  You have a stiff neck.  You have difficulty breathing. MAKE SURE YOU:   Understand these instructions.  Will watch your condition.  Will get help right away if you are not doing well or get worse. Document Released: 11/16/2005 Document Revised: 02/08/2012 Document Reviewed: 12/01/2011 [facility] Patient Information 2013 [place], [place].

## 2013-01-01 NOTE — Progress Notes (Signed)
[facility]:  Office Visit  Chief Complaint:  Chief Complaint  Patient presents with  . Cough    symptoms began 4 days ago  . Fever    with chills  . Shortness of Breath    with lying flat  . Diarrhea    3 episodes x 2 days    HPI: [name] is a 37 y.o. female who complains of  4 day h/o URI sxs mak aches, low grade temp, chills, SOB, productive cough, facial pain. Tried advil and sinus cold and flu, tylenol with minimal relief. Has sinusitis every year, got the flu vaccine. Denies smoking, asthma. + allergies. Has SOB with laying down.   Past Medical History  Diagnosis Date  . Allergy    Past Surgical History  Procedure Date  . Cholecystectomy   . Laminectomy and microdiscectomy lumbar spine   . Abdominal hysterectomy    History   Social History  . Marital Status: Married    Spouse Name: N/A    Number of Children: N/A  . Years of Education: N/A   Social History Main Topics  . Smoking status: Never Smoker   . Smokeless tobacco: Never Used  . Alcohol Use: None  . Drug Use: None  . Sexually Active: None   Other Topics Concern  . None   Social History Narrative  . None   Family History  Problem Relation Age of Onset  . Hyperlipidemia Mother   . Diabetes Father   . Hyperlipidemia Father    No Known Allergies Prior to Admission medications   Medication Sig Start Date End Date Taking? Authorizing Provider  calcium gluconate 500 MG tablet Take 1,000 mg by mouth daily.   Yes Historical Provider, MD  cholecalciferol (VITAMIN D) 1000 UNITS tablet Take 2,000 Units by mouth daily.   Yes Historical Provider, MD  Multiple Vitamin (MULTIVITAMIN) capsule Take 1 capsule by mouth daily.   Yes Historical Provider, MD     ROS: The patient denies night sweats, unintentional weight loss, chest pain, palpitations, wheezing, dyspnea on exertion, nausea, vomiting, abdominal pain, dysuria, hematuria, melena, numbness, weakness, or tingling.   All  other systems have been reviewed and were otherwise negative with the exception of those mentioned in the HPI and as above.    PHYSICAL EXAM: Filed Vitals:   01/01/13 1005  BP: 114/72  Pulse: 101  Temp: 100 F (37.8 C)  Resp: 18   Filed Vitals:   01/01/13 1005  Height: 5' 8.25" (1.734 m)  Weight: 199 lb (90.266 kg)   Body mass index is 30.04 kg/(m^2).  General: Alert, no acute distress HEENT:  Normocephalic, atraumatic, oropharynx patent. TM nl. + sinus tenderness bialterally. NO exudates. + PND, + boggy nares Cardiovascular:  Regular rate and rhythm, no rubs murmurs or gallops.  No Carotid bruits, radial pulse intact. No pedal edema.  Respiratory: Clear to auscultation bilaterally.  No wheezes, rales, or rhonchi.  No cyanosis, no use of accessory musculature GI: No organomegaly, abdomen is soft and non-tender, positive bowel sounds.  No masses. Skin: No rashes. Neurologic: Facial musculature symmetric. Psychiatric: Patient is appropriate throughout our interaction. Lymphatic: No cervical lymphadenopathy Musculoskeletal: Gait intact.   LABS: Results for orders placed during the hospital encounter of 12/24/10  DIFFERENTIAL      Component Value Range   Neutrophils Relative 63  43 - 77 %   Neutro Abs 6.1  1.7 - 7.7 K/uL   Lymphocytes Relative 26  12 - 46 %  Lymphs Abs 2.5  0.7 - 4.0 K/uL   Monocytes Relative 9  3 - 12 %   Monocytes Absolute 0.9  0.1 - 1.0 K/uL   Eosinophils Relative 2  0 - 5 %   Eosinophils Absolute 0.1  0.0 - 0.7 K/uL   Basophils Relative 0  0 - 1 %   Basophils Absolute 0.0  0.0 - 0.1 K/uL  CBC      Component Value Range   WBC 9.6  4.0 - 10.5 K/uL   RBC 4.55  3.87 - 5.11 MIL/uL   Hemoglobin 11.4 (*) 12.0 - 15.0 g/dL   HCT 69.6 (*) 29.5 - 28.4 %   MCV 76.9 (*) 78.0 - 100.0 fL   MCH 25.1 (*) 26.0 - 34.0 pg   MCHC 32.6  30.0 - 36.0 g/dL   RDW 13.2  44.0 - 10.2 %   Platelets 280  150 - 400 K/uL  COMPREHENSIVE METABOLIC PANEL      Component Value  Range   Sodium 144  135 - 145 mEq/L   Potassium 3.9  3.5 - 5.1 mEq/L   Chloride 109  96 - 112 mEq/L   CO2 24  19 - 32 mEq/L   Glucose, Bld 102 (*) 70 - 99 mg/dL   BUN 11  6 - 23 mg/dL   Creatinine, Ser .8  0.4 - 1.2 mg/dL   Calcium 9.1  8.4 - 72.5 mg/dL   Total Protein 7.6  6.0 - 8.3 g/dL   Albumin 4.2  3.5 - 5.2 g/dL   AST 18  0 - 37 U/L   ALT 18  0 - 35 U/L   Alkaline Phosphatase 78  39 - 117 U/L   Total Bilirubin 0.6  0.3 - 1.2 mg/dL   GFR calc non Af Amer >60  >60 mL/min   GFR calc Af Amer    >60 mL/min   Value: >60            The eGFR has been calculated     using the MDRD equation.     This calculation has not been     validated in all clinical     situations.     eGFR's persistently     <60 mL/min signify     possible Chronic Kidney Disease.  D-DIMER, QUANTITATIVE      Component Value Range   D-Dimer, Quant    0.00 - 0.48 ug/mL-FEU   Value: 0.27            AT THE INHOUSE ESTABLISHED CUTOFF     VALUE OF 0.48 ug/mL FEU,     THIS ASSAY HAS BEEN DOCUMENTED     IN THE LITERATURE TO HAVE     A SENSITIVITY AND NEGATIVE     PREDICTIVE VALUE OF AT LEAST     98 TO 99%.  THE TEST RESULT     SHOULD BE CORRELATED WITH     AN ASSESSMENT OF THE CLINICAL     PROBABILITY OF DVT / VTE.  POCT CARDIAC MARKERS      Component Value Range   Myoglobin, poc 43.6  12 - 200 ng/mL   CKMB, poc <1.0 (*) 1.0 - 8.0 ng/mL   Troponin i, poc 0.07  0.00 - 0.09 ng/mL   Comment       Value:            TROPONIN VALUES IN THE RANGE     OF 0.00-0.09 ng/mL [place]  NO INDICATION OF     MYOCARDIAL INJURY.                PERSISTENTLY INCREASED TROPONIN     VALUES IN THE RANGE OF 0.10-0.24     ng/mL CAN BE SEEN IN:           -UNSTABLE ANGINA           -CONGESTIVE HEART FAILURE           -MYOCARDITIS           -CHEST TRAUMA           -ARRYHTHMIAS           -LATE PRESENTING MI           -COPD       CLINICAL FOLLOW-UP RECOMMENDED.                TROPONIN VALUES >=0.25 ng/mL     INDICATE  POSSIBLE MYOCARDIAL     ISCHEMIA. SERIAL TESTING     RECOMMENDED.  POCT CARDIAC MARKERS      Component Value Range   Myoglobin, poc 29.1  12 - 200 ng/mL   CKMB, poc <1.0 (*) 1.0 - 8.0 ng/mL   Troponin i, poc <0.05  0.00 - 0.09 ng/mL   Comment       Value:            TROPONIN VALUES IN THE RANGE     OF 0.00-0.09 ng/mL SHOW     NO INDICATION OF     MYOCARDIAL INJURY.                PERSISTENTLY INCREASED TROPONIN     VALUES IN THE RANGE OF 0.10-0.24     ng/mL CAN BE SEEN IN:           -UNSTABLE ANGINA           -CONGESTIVE HEART FAILURE           -MYOCARDITIS           -CHEST TRAUMA           -ARRYHTHMIAS           -LATE PRESENTING MI           -COPD       CLINICAL FOLLOW-UP RECOMMENDED.                TROPONIN VALUES >=0.25 ng/mL     INDICATE POSSIBLE MYOCARDIAL     ISCHEMIA. SERIAL TESTING     RECOMMENDED.  URINALYSIS, ROUTINE W REFLEX MICROSCOPIC      Component Value Range   Color, Urine YELLOW  YELLOW   APPearance CLOUDY (*) CLEAR   Specific Gravity, Urine 1.024  1.005 - 1.030   pH 6.0  5.0 - 8.0   Urine Glucose, Fasting NEGATIVE  NEGATIVE mg/dL   Hgb urine dipstick NEGATIVE  NEGATIVE   Bilirubin Urine NEGATIVE  NEGATIVE   Ketones, ur NEGATIVE  NEGATIVE mg/dL   Protein, ur NEGATIVE  NEGATIVE mg/dL   Urobilinogen, UA 0.2  0.0 - 1.0 mg/dL   Nitrite NEGATIVE  NEGATIVE   Leukocytes, UA    NEGATIVE   Value: NEGATIVE MICROSCOPIC NOT DONE ON URINES WITH NEGATIVE PROTEIN, BLOOD, LEUKOCYTES, NITRITE, OR GLUCOSE <1000 mg/dL.     EKG/XRAY:   Primary read interpreted by Dr. Conley Rolls at [facility]   ASSESSMENT/PLAN: Encounter Diagnoses  Name Primary?  . Flu-like symptoms Yes  . Acute sinusitis   . Cough  Most likely a combination of influenza and sinusitis Outside window for Tamiflu Will treat sinusitis with Augmentin, advise that she may have diarrhea with this  Rx also Atrovent NS, Tessalon Perles, Hydromet syrup Work note given for 2-3 days off, May return to work on  Tuesday if feels better F/u prn    LE, THAO PHUONG, DO 01/01/2013 10:40 AM

## 2013-04-05 ENCOUNTER — Other Ambulatory Visit: Payer: Self-pay | Admitting: Internal Medicine

## 2013-04-05 DIAGNOSIS — D1803 Hemangioma of intra-abdominal structures: Secondary | ICD-10-CM

## 2013-04-07 ENCOUNTER — Other Ambulatory Visit: Payer: Self-pay

## 2013-04-14 ENCOUNTER — Other Ambulatory Visit: Payer: Self-pay

## 2013-04-17 ENCOUNTER — Ambulatory Visit
Admission: RE | Admit: 2013-04-17 | Discharge: 2013-04-17 | Disposition: A | Discharge status: Home / Self Care | Payer: 59 | Source: Ambulatory Visit | Attending: Internal Medicine | Admitting: Internal Medicine

## 2013-04-17 DIAGNOSIS — D1803 Hemangioma of intra-abdominal structures: Secondary | ICD-10-CM

## 2013-04-17 MED ORDER — GADOXETATE DISODIUM 0.25 MMOL/ML IV SOLN: 9.0000 mL | Freq: Once | INTRAVENOUS | Status: AC | PRN

## 2015-01-07 ENCOUNTER — Other Ambulatory Visit: Payer: Self-pay | Admitting: Obstetrics & Gynecology

## 2015-01-08 LAB — CYTOLOGY - PAP

## 2015-01-10 ENCOUNTER — Other Ambulatory Visit: Payer: Self-pay | Admitting: Obstetrics & Gynecology

## 2015-01-10 DIAGNOSIS — R928 Other abnormal and inconclusive findings on diagnostic imaging of breast: Secondary | ICD-10-CM

## 2015-01-18 ENCOUNTER — Ambulatory Visit
Admission: RE | Admit: 2015-01-18 | Discharge: 2015-01-18 | Disposition: A | Discharge status: Home / Self Care | Payer: 59 | Source: Ambulatory Visit | Attending: Obstetrics & Gynecology | Admitting: Obstetrics & Gynecology

## 2015-01-18 DIAGNOSIS — R928 Other abnormal and inconclusive findings on diagnostic imaging of breast: Secondary | ICD-10-CM

## 2015-06-12 ENCOUNTER — Other Ambulatory Visit: Payer: Self-pay | Admitting: Obstetrics & Gynecology

## 2015-06-12 DIAGNOSIS — N6320 Unspecified lump in the left breast, unspecified quadrant: Secondary | ICD-10-CM

## 2015-07-22 ENCOUNTER — Ambulatory Visit
Admission: RE | Admit: 2015-07-22 | Discharge: 2015-07-22 | Disposition: A | Discharge status: Home / Self Care | Payer: 59 | Source: Ambulatory Visit | Attending: Obstetrics & Gynecology | Admitting: Obstetrics & Gynecology

## 2015-07-22 DIAGNOSIS — N6320 Unspecified lump in the left breast, unspecified quadrant: Secondary | ICD-10-CM

## 2015-12-12 ENCOUNTER — Other Ambulatory Visit: Payer: Self-pay | Admitting: Obstetrics & Gynecology

## 2015-12-12 DIAGNOSIS — N6320 Unspecified lump in the left breast, unspecified quadrant: Secondary | ICD-10-CM

## 2015-12-18 ENCOUNTER — Telehealth: Payer: Self-pay

## 2015-12-18 NOTE — Telephone Encounter (Signed)
I spoke to patient to reschedule appt b/c Dr. Rexene Alberts has to leave early tomorrow. Patient was able to reschedule to 2pm.

## 2015-12-19 ENCOUNTER — Ambulatory Visit (INDEPENDENT_AMBULATORY_CARE_PROVIDER_SITE_OTHER): Payer: 59 | Admitting: Neurology

## 2015-12-19 ENCOUNTER — Encounter: Payer: Self-pay | Admitting: Neurology

## 2015-12-19 ENCOUNTER — Institutional Professional Consult (permissible substitution): Payer: 59 | Admitting: Neurology

## 2015-12-19 VITALS — BP 122/70 | HR 76 | Resp 16 | Ht 68.0 in | Wt 205.0 lb

## 2015-12-19 DIAGNOSIS — F458 Other somatoform disorders: Secondary | ICD-10-CM

## 2015-12-19 DIAGNOSIS — R0683 Snoring: Secondary | ICD-10-CM

## 2015-12-19 DIAGNOSIS — E669 Obesity, unspecified: Secondary | ICD-10-CM | POA: Diagnosis not present

## 2015-12-19 DIAGNOSIS — G4761 Periodic limb movement disorder: Secondary | ICD-10-CM | POA: Diagnosis not present

## 2015-12-19 DIAGNOSIS — G4719 Other hypersomnia: Secondary | ICD-10-CM

## 2015-12-19 DIAGNOSIS — G478 Other sleep disorders: Secondary | ICD-10-CM | POA: Diagnosis not present

## 2015-12-19 NOTE — Progress Notes (Signed)
Subjective:    Patient ID: [name] is a 40 y.o. female.  HPI    Star Age, MD, PhD [facility] [address], [address] [place],  [address]  Dear Dr. Dagmar Hait,   I saw your patient, [name], upon your kind request in my neurologic clinic today for initial consultation of her sleep disorder, in particular, concern for underlying obstructive sleep apnea. The patient is unaccompanied today. As you know, [name] is a 40 year old right-handed woman with an underlying medical history of allergies, hepatic hemangioma, anemia, degenerative disc disease, and obesity, who reports snoring and excessive daytime somnolence. She does not wake up rested. She denies morning headaches, nocturia or RLS symptoms but does report leg twitching and kicking in her sleep as reported to her by her husband. Her bedtime is around 10 PM and rise time is 6 AM in general. She works usually from 8 AM to 5 PM and twice a month she has a 24-hour shift, and once every 9 weeks she has 1 week of call. I reviewed your office note from 12/09/2015, which you kindly included. 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 She is married and lives with her husband and one son, age 33 and one [name], age 65. She has had some stress. Her Epworth sleepiness score is 11 out of 24 today, her fatigue score is 41 out of 63. She is a nonsmoker, drinks alcohol very occasionally, and soda 1-2 per day. She has had some bruxism. She has had some problems focusing and with concentration. She saw a psychologist for this as I understand. She has had some biofeedback.  Her Past Medical History Is Significant For: Past Medical History  Diagnosis Date  . Allergy   . Hepatic hemangioma   . Daytime somnolence   . Fatigue   . Snoring   . GERD (gastroesophageal reflux disease)     Her Past Surgical History Is Significant For: Past Surgical History  Procedure  Laterality Date  . Cholecystectomy    . Laminectomy and microdiscectomy lumbar spine    . Abdominal hysterectomy      Her Family History Is Significant For: Family History  Problem Relation Age of Onset  . Hyperlipidemia Mother   . Seizures Mother   . Hypothyroidism Mother   . Diabetes Father   . Hyperlipidemia Father   . Arthritis Father   . CAD Maternal Uncle   . CAD Paternal Uncle   . Alzheimer's disease Maternal Grandmother   . Diabetes Maternal Grandfather   . CAD Paternal Grandfather     Her Social History Is Significant For: Social History   Social History  . Marital Status: Married    Spouse Name: N/A  . Number of Children: 1  . Years of Education: BS   Occupational History  . [employer]    Social History Main Topics  . Smoking status: Never Smoker   . Smokeless tobacco: Never Used  . Alcohol Use: 0.0 oz/week    0 Standard drinks or equivalent per week  . Drug Use: No  . Sexual Activity: Not Asked   Other Topics Concern  . None   Social History Narrative   Drinks about 1-2 sodas a day     Her Allergies Are:  No Known Allergies:   Her Current Medications Are:  Outpatient Encounter Prescriptions as of 12/19/2015  Medication Sig  . cetirizine (ZYRTEC) 10 MG tablet Take 10 mg by  mouth.  . Multiple Vitamin (MULTIVITAMIN) capsule Take 1 capsule by mouth daily.  . [DISCONTINUED] albuterol (PROVENTIL HFA;VENTOLIN HFA) 108 (90 BASE) MCG/ACT inhaler Inhale 2 puffs into the lungs every 6 (six) hours as needed for wheezing.  . [DISCONTINUED] amoxicillin-clavulanate (AUGMENTIN) 875-125 MG per tablet Take 1 tablet by mouth 2 (two) times daily.  . [DISCONTINUED] calcium gluconate 500 MG tablet Take 1,000 mg by mouth daily.  . [DISCONTINUED] cholecalciferol (VITAMIN D) 1000 UNITS tablet Take 2,000 Units by mouth daily.  . [DISCONTINUED] HYDROcodone-homatropine (HYCODAN) 5-1.5 MG/5ML syrup Take 5 mLs by mouth at bedtime as needed for cough.  . [DISCONTINUED] ipratropium  (ATROVENT) 0.03 % nasal spray Place 2 sprays into the nose every 12 (twelve) hours.   No facility-administered encounter medications on file as of 12/19/2015.  :  Review of Systems:  Out of a complete 14 point review of systems, all are reviewed and negative with the exception of these symptoms as listed below:   Review of Systems  Constitutional: Positive for fatigue.  Musculoskeletal:       Aching muscles   Allergic/Immunologic: Positive for environmental allergies.  Neurological:       Shift work- dayshift, on call at night every 9 weeks, works 24 hour shift 2x a month, no trouble falling asleep, trouble staying asleep, snoring, wakes up feeling tired, daytime tiredness, falls asleep when sitting still.   Psychiatric/Behavioral:       Not enough sleep, decreased energy    Epworth Sleepiness Scale 0= would never doze 1= slight chance of dozing 2= moderate chance of dozing 3= high chance of dozing  Sitting and reading:1 Watching TV:2 Sitting inactive in a public place (ex. Theater or meeting):0 As a passenger in a car for an hour without a break:3 Lying down to rest in the afternoon:3 Sitting and talking to someone:0 Sitting quietly after lunch (no alcohol):2 In a car, while stopped in traffic:0 Total:11  Objective:  Neurologic Exam  Physical Exam Physical Examination:   Filed Vitals:   12/19/15 1411  BP: 122/70  Pulse: 76  Resp: 16    General Examination: The patient is a very pleasant 40 y.o. female in no acute distress. She appears well-developed and well-nourished and well groomed.   HEENT: Normocephalic, atraumatic, pupils are equal, round and reactive to light and accommodation. Funduscopic exam is normal with sharp disc margins noted. Extraocular tracking is good without limitation to gaze excursion or nystagmus noted. Normal smooth pursuit is noted. Hearing is grossly intact. Tympanic membranes are clear bilaterally. Face is symmetric with normal facial  animation and normal facial sensation. Speech is clear with no dysarthria noted. There is no hypophonia. There is no lip, neck/head, jaw or voice tremor. Neck is supple with full range of passive and active motion. There are no carotid bruits on auscultation. Oropharynx exam reveals: mild mouth dryness, good dental hygiene and mild airway crowding, due to larger appearing tongue. Mallampati is class I. Tongue protrudes centrally and palate elevates symmetrically. Tonsils are 1+. Neck size is 14 inches. She has a minimal overbite. Nasal inspection reveals no significant nasal mucosal bogginess or redness and no septal deviation.   Chest: Clear to auscultation without wheezing, rhonchi or crackles noted.  Heart: S1+S2+0, regular and normal without murmurs, rubs or gallops noted.   Abdomen: Soft, non-tender and non-distended with normal bowel sounds appreciated on auscultation.  Extremities: There is no pitting edema in the distal lower extremities bilaterally. Pedal pulses are intact.  Skin: Warm and dry without  trophic changes noted. There are no varicose veins.  Musculoskeletal: exam reveals no obvious joint deformities, tenderness or joint swelling or erythema.   Neurologically:  Mental status: The patient is awake, alert and oriented in all 4 spheres. Her immediate and remote memory, attention, language skills and fund of knowledge are appropriate. There is no evidence of aphasia, agnosia, apraxia or anomia. Speech is clear with normal prosody and enunciation. Thought process is linear. Mood is normal and affect is normal.  Cranial nerves II - XII are as described above under HEENT exam. In addition: shoulder shrug is normal with equal shoulder height noted. Motor exam: Normal bulk, strength and tone is noted. There is no drift, tremor or rebound. Romberg is negative. Reflexes are 2+ throughout. Babinski: Toes are flexor bilaterally. Fine motor skills and coordination: intact with normal finger  taps, normal hand movements, normal rapid alternating patting, normal foot taps and normal foot agility.  Cerebellar testing: No dysmetria or intention tremor on finger to nose testing. Heel to shin is unremarkable bilaterally. There is no truncal or gait ataxia.  Sensory exam: intact to light touch, pinprick, vibration, temperature sense in the upper and lower extremities.  Gait, station and balance: She stands easily. No veering to one side is noted. No leaning to one side is noted. Posture is age-appropriate and stance is narrow based. Gait shows normal stride length and normal pace. No problems turning are noted. She turns en bloc. Tandem walk is unremarkable.   Assessment and Plan:   In summary, [name] is a very pleasant 40 y.o.-year old female with an underlying medical history of allergies, hepatic hemangioma, anemia, degenerative disc disease, and obesity, whose history and physical exam are indeed concerning for obstructive sleep apnea (OSA). 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 She does not wake up rested despite having adequate sleep time. Some shift work is also disruptive to her schedule.  I had a long chat with the patient about my findings and the diagnosis of OSA, its prognosis and treatment options. We talked about medical treatments, surgical interventions and non-pharmacological approaches. I explained in particular the risks and ramifications of untreated moderate to severe OSA, especially with respect to developing cardiovascular disease down the Road, including congestive heart failure, difficult to treat hypertension, cardiac arrhythmias, or stroke. Even type 2 diabetes has, in part, been linked to untreated OSA. Symptoms of untreated OSA include daytime sleepiness, memory problems, mood irritability and mood disorder such as depression and anxiety, lack of energy, as well as recurrent headaches, especially morning headaches. We talked about trying to maintain a healthy lifestyle  in general, as well as the importance of weight control. I encouraged the patient to eat healthy, exercise daily and keep well hydrated, to keep a scheduled bedtime and wake time routine, to not skip any meals and eat healthy snacks in between meals. I advised the patient not to drive when feeling sleepy. I recommended the following at this time: sleep study with potential positive airway pressure titration. (We will score hypopneas at 4% and split the sleep study into diagnostic and treatment portion, if the estimated. 2 hour AHI is >20/h).   I explained the sleep test procedure to the patient and also outlined possible surgical and non-surgical treatment options of OSA, including the use of a custom-made dental device (which would require a referral to a specialist dentist or oral surgeon), upper airway surgical options, such as pillar implants, radiofrequency surgery, tongue base surgery, and UPPP (which would involve a  referral to an ENT surgeon). Rarely, jaw surgery such as mandibular advancement may be considered.  I also explained the CPAP treatment option to the patient, who indicated that she would be willing to try CPAP if the need arises. I explained the importance of being compliant with PAP treatment, not only for insurance purposes but primarily to improve Her symptoms, and for the patient's long term health benefit, including to reduce Her cardiovascular risks. I answered all her questions today and the patient was in agreement. I would like to see her back after the sleep study is completed and encouraged her to call with any interim questions, concerns, problems or updates.   Thank you very much for allowing me to participate in the care of this nice patient. If I can be of any further assistance to you please do not hesitate to call me at [phone].  Sincerely,   Star Age, MD, PhD

## 2015-12-19 NOTE — Patient Instructions (Signed)

## 2016-01-09 ENCOUNTER — Ambulatory Visit (INDEPENDENT_AMBULATORY_CARE_PROVIDER_SITE_OTHER): Payer: 59 | Admitting: Neurology

## 2016-01-09 DIAGNOSIS — G4720 Circadian rhythm sleep disorder, unspecified type: Secondary | ICD-10-CM

## 2016-01-09 DIAGNOSIS — G4710 Hypersomnia, unspecified: Secondary | ICD-10-CM | POA: Diagnosis not present

## 2016-01-09 DIAGNOSIS — R0683 Snoring: Secondary | ICD-10-CM

## 2016-01-09 NOTE — Sleep Study (Signed)
Please see the scanned sleep study interpretation located in the procedure tab in the chart view section.  

## 2016-01-10 ENCOUNTER — Telehealth: Payer: Self-pay | Admitting: Neurology

## 2016-01-10 NOTE — Telephone Encounter (Signed)
Patient referred by Dr. Dagmar Hait, seen by me on 12/19/15, diagnostic PSG on 01/09/16.   Please call and notify the patient that the recent sleep study did not show any significant obstructive sleep apnea or leg twitching. Please inform patient that I would like to go over the details of the study during a follow up appointment. Arrange a followup appointment. Also, route or fax report to PCP and referring MD, if other than PCP.  Once you have spoken to patient, you can close this encounter.   Thanks,  Star Age, MD, PhD [facility])

## 2016-01-13 NOTE — Telephone Encounter (Signed)
LM with results below (per DPR) left call back number for patient to make f/u appt.

## 2016-01-23 ENCOUNTER — Ambulatory Visit
Admission: RE | Admit: 2016-01-23 | Discharge: 2016-01-23 | Disposition: A | Discharge status: Home / Self Care | Payer: 59 | Source: Ambulatory Visit | Attending: Obstetrics & Gynecology | Admitting: Obstetrics & Gynecology

## 2016-01-23 DIAGNOSIS — N6320 Unspecified lump in the left breast, unspecified quadrant: Secondary | ICD-10-CM

## 2016-02-26 ENCOUNTER — Encounter: Payer: Self-pay | Admitting: Neurology

## 2016-02-26 ENCOUNTER — Ambulatory Visit (INDEPENDENT_AMBULATORY_CARE_PROVIDER_SITE_OTHER): Payer: 59 | Admitting: Neurology

## 2016-02-26 VITALS — BP 104/58 | HR 72 | Resp 16 | Ht 68.0 in | Wt 212.0 lb

## 2016-02-26 DIAGNOSIS — Z658 Other specified problems related to psychosocial circumstances: Secondary | ICD-10-CM | POA: Diagnosis not present

## 2016-02-26 DIAGNOSIS — G478 Other sleep disorders: Secondary | ICD-10-CM

## 2016-02-26 DIAGNOSIS — F439 Reaction to severe stress, unspecified: Secondary | ICD-10-CM

## 2016-02-26 NOTE — Progress Notes (Signed)
Subjective:    Patient ID: [name] is a 40 y.o. female.  HPI     Interim history:   [name] is a 40 year old right-handed woman with an underlying medical history of allergies, hepatic hemangioma, anemia, degenerative disc disease, and obesity, who presents for follow-up consultation of her sleep disturbance, after recent sleep study. The patient is unaccompanied today. I first met her on 12/19/2015 at the request of her primary care physician, at which time the patient reported snoring, nonrestorative sleep, and excessive daytime somnolence. I invited her back for sleep study. She had a baseline sleep study on 01/09/2016. I went over her test results with her in detail today. Sleep efficiency was normal at 96.7% with a normal sleep latency of 6 minutes and wake after sleep onset of 9.5 minutes with minimal sleep fragmentation noted. She had a normal arousal index. She had an increased percentage of stage II sleep, a mildly decreased percentage of slow-wave sleep, and a decreased percentage of REM sleep at 13.9% with a mildly prolonged REM latency of 135.5 minutes. She had no significant PLMS, EKG or EEG changes. She had minimal intermittent snoring. Total AHI was 0.5 per hour, average oxygen saturation was 96%, nadir was 93%.   Today, 02/26/2016: She reports stress, recent HA, but not severe. She had 2 recent interviews, she had intermittent tingling in the L hand fingers and around the mouth, nothing sustained.She does not have a history of migraines. She had no one-sided weakness or numbness and reports no recurrent headaches. She tries to hydrate better.  Previously:   12/19/2015: She reports snoring and excessive daytime somnolence. She does not wake up rested. She denies morning headaches, nocturia or RLS symptoms but does report leg twitching and kicking in her sleep as reported to her by her husband. Her bedtime is around 10 PM and rise time is 6 AM in general. She works usually  from 8 AM to 5 PM and twice a month she has a 24-hour shift, and once every 9 weeks she has 1 week of call. I reviewed your office note from 12/09/2015, which you kindly included. 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 She is married and lives with her husband and one son, age 62 and one Joslyn Hy, age 50. She has had some stress. Her Epworth sleepiness score is 11 out of 24 today, her fatigue score is 41 out of 63. She is a nonsmoker, drinks alcohol very occasionally, and soda 1-2 per day. She has had some bruxism. She has had some problems focusing and with concentration. She saw a psychologist for this as I understand. She has had some biofeedback.  Her Past Medical History Is Significant For: Past Medical History  Diagnosis Date  . Allergy   . Hepatic hemangioma   . Daytime somnolence   . Fatigue   . Snoring   . GERD (gastroesophageal reflux disease)     Her Past Surgical History Is Significant For: Past Surgical History  Procedure Laterality Date  . Cholecystectomy    . Laminectomy and microdiscectomy lumbar spine    . Abdominal hysterectomy      Her Family History Is Significant For: Family History  Problem Relation Age of Onset  . Hyperlipidemia Mother   . Seizures Mother   . Hypothyroidism Mother   . Diabetes Father   . Hyperlipidemia Father   . Arthritis Father   . CAD Maternal Uncle   . CAD Paternal Uncle   .  Alzheimer's disease Maternal Grandmother   . Diabetes Maternal Grandfather   . CAD Paternal Grandfather     Her Social History Is Significant For: Social History   Social History  . Marital Status: Married    Spouse Name: N/A  . Number of Children: 1  . Years of Education: BS   Occupational History  . [employer]    Social History Main Topics  . Smoking status: Never Smoker   . Smokeless tobacco: Never Used  . Alcohol Use: 0.0 oz/week    0 Standard drinks or equivalent per week  . Drug Use: No  . Sexual Activity: Not Asked    Other Topics Concern  . None   Social History Narrative   Drinks about 1-2 sodas a day     Her Allergies Are:  No Known Allergies:   Her Current Medications Are:  Outpatient Encounter Prescriptions as of 02/26/2016  Medication Sig  . cetirizine (ZYRTEC) 10 MG tablet Take 10 mg by mouth.  . Multiple Vitamin (MULTIVITAMIN) capsule Take 1 capsule by mouth daily.   No facility-administered encounter medications on file as of 02/26/2016.  :  Review of Systems:  Out of a complete 14 point review of systems, all are reviewed and negative with the exception of these symptoms as listed below:   Review of Systems  Neurological:       Patient is here to discuss sleep study.  New problem. Patient has had some numbness in 2 L fingers that comes and goes. Also has had some numbness in lower lip. R sided headache 4 days ago. Denies h/o headaches and migraines      Objective:  Neurologic Exam  Physical Exam Physical Examination:   Filed Vitals:   02/26/16 1408  BP: 104/58  Pulse: 72  Resp: 16    General Examination: The patient is a very pleasant 40 y.o. female in no acute distress. She appears well-developed and well-nourished and well groomed. She is in good spirits today.  HEENT: Normocephalic, atraumatic, pupils are equal, round and reactive to light and accommodation. Extraocular tracking is good without limitation to gaze excursion or nystagmus noted. Normal smooth pursuit is noted. Hearing is grossly intact. Face is symmetric with normal facial animation and normal facial sensation. Speech is clear with no dysarthria noted. There is no hypophonia. There is no lip, neck/head, jaw or voice tremor. Neck is supple with full range of passive and active motion. There are no carotid bruits on auscultation. Oropharynx exam reveals: mild mouth dryness, good dental hygiene and mild airway crowding, due to larger appearing tongue. Mallampati is class I. Tongue protrudes centrally and palate  elevates symmetrically. Tonsils are 1+. She has a minimal overbite. Nasal inspection reveals no significant nasal mucosal bogginess or redness and no septal deviation.   Chest: Clear to auscultation without wheezing, rhonchi or crackles noted.  Heart: S1+S2+0, regular and normal without murmurs, rubs or gallops noted.   Abdomen: Soft, non-tender and non-distended with normal bowel sounds appreciated on auscultation.  Extremities: There is no pitting edema in the distal lower extremities bilaterally. Pedal pulses are intact.  Skin: Warm and dry without trophic changes noted. There are no varicose veins.  Musculoskeletal: exam reveals no obvious joint deformities, tenderness or joint swelling or erythema.   Neurologically:  Mental status: The patient is awake, alert and oriented in all 4 spheres. Her immediate and remote memory, attention, language skills and fund of knowledge are appropriate. There is no evidence of aphasia, agnosia, apraxia or  anomia. Speech is clear with normal prosody and enunciation. Thought process is linear. Mood is normal and affect is normal.  Cranial nerves II - XII are as described above under HEENT exam. In addition: shoulder shrug is normal with equal shoulder height noted. Motor exam: Normal bulk, strength and tone is noted. There is no drift, tremor or rebound. Romberg is negative. Reflexes are 2+ throughout. Fine motor skills and coordination: intact with normal finger taps, normal hand movements, normal rapid alternating patting, normal foot taps and normal foot agility.  Cerebellar testing: No dysmetria or intention tremor on finger to nose testing. Heel to shin is unremarkable bilaterally. There is no truncal or gait ataxia.  Sensory exam: intact to light touch, pinprick, vibration, temperature sense in the upper and lower extremities.  Gait, station and balance: She stands easily. No veering to one side is noted. No leaning to one side is noted. Posture is  age-appropriate and stance is narrow based. Gait shows normal stride length and normal pace. No problems turning are noted. Tandem walk is unremarkable.   Assessment and Plan:   In summary, [name] is a very pleasant 40 year old female with an underlying medical history of allergies, hepatic hemangioma, anemia, degenerative disc disease, and obesity, who presents for follow-up consultation after her recent sleep study. Her baseline sleep study from February 2017 showed benign findings. I explained the findings at length today. She does endorse additional stress recently. She just completed 2 new job interviews. She also is going to start her week of call this Friday. She had a recent headache, most likely tension type. Physical exam and neurological exam in particular a nonfocal and she is reassured in that regard. We talked about headache triggers. We talked about sleep hygiene again today. She was given instructions in writing. Thankfully, her sleep study did not show any organic sleep disorder, in particular, she had no sleep disordered breathing, no significant PLMS. Sleep architecture was actually very good. Oxygen saturations were very good, nadir of 93%. At this juncture, I suggested an as needed follow-up. I answered all her questions today and she was in agreement.  I spent 20 minutes in total face-to-face time with the patient, more than 50% of which was spent in counseling and coordination of care, reviewing test results, reviewing medication and discussing or reviewing the diagnosis of sleep disturbance, and stress, the prognosis and treatment options.

## 2016-02-26 NOTE — Patient Instructions (Signed)
Your sleep study looked good!  Please remember, common headache triggers are: sleep deprivation, dehydration, overheating, stress, hypoglycemia or skipping meals and blood sugar fluctuations, excessive pain medications or excessive alcohol use or caffeine withdrawal. Some people have food triggers such as aged cheese, orange juice or chocolate, especially dark chocolate, or MSG (monosodium glutamate). Try to avoid these headache triggers as much possible. It may be helpful to keep a headache diary to figure out what makes your headaches worse or brings them on and what alleviates them. Some people report headache onset after exercise but studies have shown that regular exercise may actually prevent headaches from coming. If you have exercise-induced headaches, please make sure that you drink plenty of fluid before and after exercising and that you do not over do it and do not overheat.  Please remember to try to maintain good sleep hygiene, which means: Keep a regular sleep and wake schedule, try not to exercise or have a meal within 2 hours of your bedtime, try to keep your bedroom conducive for sleep, that is, cool and dark, without light distractors such as an illuminated alarm clock, and refrain from watching TV right before sleep or in the middle of the night and do not keep the TV or radio on during the night. Also, try not to use or play on electronic devices at bedtime, such as your cell phone, tablet PC or laptop. If you like to read at bedtime on an electronic device, try to dim the background light as much as possible. Do not eat in the middle of the night.   Your exam looks good!

## 2017-06-23 ENCOUNTER — Encounter (HOSPITAL_COMMUNITY): Payer: Self-pay | Admitting: Emergency Medicine

## 2017-06-23 ENCOUNTER — Ambulatory Visit (HOSPITAL_COMMUNITY)
Admission: EM | Admit: 2017-06-23 | Discharge: 2017-06-23 | Disposition: A | Discharge status: Home / Self Care | Payer: 59 | Attending: Family Medicine | Admitting: Family Medicine

## 2017-06-23 DIAGNOSIS — J40 Bronchitis, not specified as acute or chronic: Secondary | ICD-10-CM

## 2017-06-23 MED ORDER — AZITHROMYCIN 250 MG PO TABS: 250.0000 mg | ORAL_TABLET | Freq: Every day | ORAL | 0 refills | Status: AC

## 2017-06-23 MED ORDER — HYDROCODONE-HOMATROPINE 5-1.5 MG/5ML PO SYRP: 5.0000 mL | ORAL_SOLUTION | Freq: Four times a day (QID) | ORAL | 0 refills | Status: AC | PRN

## 2017-06-23 NOTE — ED Triage Notes (Signed)
Cough started Monday, sore throat started Wednesday, one week ago.  Patient has had a stuffy nose, sneezing, headache.  Pressure in face.  Coughing up yellow phlegm

## 2017-06-26 NOTE — ED Provider Notes (Signed)
  [name]   [id number] 06/23/17 Arrival Time: 1908  ASSESSMENT & PLAN:  1. Bronchitis    Meds ordered this encounter  Medications  . HYDROcodone-homatropine (HYCODAN) 5-1.5 MG/5ML syrup    Sig: Take 5 mLs by mouth every 6 (six) hours as needed for cough.    Dispense:  90 mL    Refill:  0  . azithromycin (ZITHROMAX) 250 MG tablet    Sig: Take 1 tablet (250 mg total) by mouth daily. Take first 2 tablets together, then 1 every day until finished.    Dispense:  6 tablet    Refill:  0    Medication sedation precautions. OTC as needed. Reviewed expectations re: course of current medical issues. Questions answered. Outlined signs and symptoms indicating need for more acute intervention. Patient verbalized understanding. After Visit Summary given.   SUBJECTIVE:  [name] is a 41 y.o. female who presents with complaint of URI symptoms starting over one week ago. Coughing bothering her the most now. Not improving. Cough is non-productive. No fevers reported. Decreased sleep secondary to coughing. OTC without relief. Mild nasal congestion. Non-smoker.  ROS: As per HPI.   OBJECTIVE:  Vitals:   06/23/17 1930  BP: 125/72  Pulse: 75  Resp: (!) 22  Temp: 98.2 F (36.8 C)  TempSrc: Oral  SpO2: 98%     General appearance: alert; no distress HEENT: nasal congestion Neck: supple Lungs: clear to auscultation bilaterally; dry cough Skin: warm and dry  No Known Allergies  PMHx, SurgHx, SocialHx, Medications, and Allergies were reviewed in the Visit Navigator and updated as appropriate.      Vanessa Kick, MD 06/26/17 1201

## 2019-06-20 ENCOUNTER — Other Ambulatory Visit: Payer: Self-pay | Admitting: Obstetrics & Gynecology

## 2019-06-20 DIAGNOSIS — R928 Other abnormal and inconclusive findings on diagnostic imaging of breast: Secondary | ICD-10-CM

## 2019-06-23 ENCOUNTER — Other Ambulatory Visit: Payer: Self-pay | Admitting: Obstetrics & Gynecology

## 2019-06-23 ENCOUNTER — Ambulatory Visit
Admission: RE | Admit: 2019-06-23 | Discharge: 2019-06-23 | Disposition: A | Discharge status: Home / Self Care | Payer: 59 | Source: Ambulatory Visit | Attending: Obstetrics & Gynecology | Admitting: Obstetrics & Gynecology

## 2019-06-23 ENCOUNTER — Ambulatory Visit
Admission: RE | Admit: 2019-06-23 | Discharge: 2019-06-23 | Disposition: A | Discharge status: Home / Self Care | Payer: BC Managed Care – PPO | Source: Ambulatory Visit | Attending: Obstetrics & Gynecology | Admitting: Obstetrics & Gynecology

## 2019-06-23 ENCOUNTER — Other Ambulatory Visit: Payer: Self-pay

## 2019-06-23 DIAGNOSIS — R928 Other abnormal and inconclusive findings on diagnostic imaging of breast: Secondary | ICD-10-CM

## 2019-06-23 DIAGNOSIS — N6320 Unspecified lump in the left breast, unspecified quadrant: Secondary | ICD-10-CM

## 2019-12-29 ENCOUNTER — Ambulatory Visit
Admission: RE | Admit: 2019-12-29 | Discharge: 2019-12-29 | Disposition: A | Discharge status: Home / Self Care | Payer: BC Managed Care – PPO | Source: Ambulatory Visit | Attending: Obstetrics & Gynecology | Admitting: Obstetrics & Gynecology

## 2019-12-29 ENCOUNTER — Other Ambulatory Visit: Payer: Self-pay | Admitting: Obstetrics & Gynecology

## 2019-12-29 ENCOUNTER — Other Ambulatory Visit: Payer: Self-pay

## 2019-12-29 DIAGNOSIS — N6320 Unspecified lump in the left breast, unspecified quadrant: Secondary | ICD-10-CM

## 2020-06-28 ENCOUNTER — Ambulatory Visit
Admission: RE | Admit: 2020-06-28 | Discharge: 2020-06-28 | Disposition: A | Discharge status: Home / Self Care | Payer: BC Managed Care – PPO | Source: Ambulatory Visit | Attending: Obstetrics & Gynecology | Admitting: Obstetrics & Gynecology

## 2020-06-28 ENCOUNTER — Other Ambulatory Visit: Payer: Self-pay

## 2020-06-28 DIAGNOSIS — N6320 Unspecified lump in the left breast, unspecified quadrant: Secondary | ICD-10-CM

## 2020-07-24 ENCOUNTER — Other Ambulatory Visit (HOSPITAL_COMMUNITY): Payer: Self-pay | Admitting: Adult Health

## 2020-12-10 ENCOUNTER — Other Ambulatory Visit (HOSPITAL_COMMUNITY): Payer: Self-pay | Admitting: Registered Nurse

## 2020-12-10 MED FILL — BENZONATATE 100 MG CAPS: 100 | 14 days supply | Qty: 42 | Fill #0

## 2020-12-10 MED FILL — ALBUTEROL SULFATE HFA 108 (: 108 (90 BAS | 25 days supply | Qty: 18 | Fill #0

## 2020-12-31 ENCOUNTER — Other Ambulatory Visit (HOSPITAL_COMMUNITY): Payer: Self-pay | Admitting: Internal Medicine

## 2020-12-31 MED FILL — NITROFURANTOIN MONO-MCR 100: 100 | 7 days supply | Qty: 14 | Fill #0

## 2021-01-09 MED FILL — OZEMPIC (1 MG/DOSE) 4 MG/3M: 4 | 90 days supply | Qty: 9 | Fill #0

## 2021-04-10 ENCOUNTER — Other Ambulatory Visit (HOSPITAL_COMMUNITY): Payer: Self-pay

## 2021-04-10 MED FILL — Semaglutide Soln Pen-inj 1 MG/DOSE (4 MG/3ML): SUBCUTANEOUS | 84 days supply | Qty: 9 | Fill #0 | Status: AC

## 2021-05-01 ENCOUNTER — Other Ambulatory Visit: Payer: Self-pay

## 2021-05-01 ENCOUNTER — Emergency Department
Admission: EM | Admit: 2021-05-01 | Discharge: 2021-05-01 | Disposition: A | Discharge status: Home / Self Care | Payer: No Typology Code available for payment source | Attending: Emergency Medicine | Admitting: Emergency Medicine

## 2021-05-01 DIAGNOSIS — S6992XA Unspecified injury of left wrist, hand and finger(s), initial encounter: Secondary | ICD-10-CM | POA: Diagnosis present

## 2021-05-01 DIAGNOSIS — S61012A Laceration without foreign body of left thumb without damage to nail, initial encounter: Secondary | ICD-10-CM | POA: Diagnosis not present

## 2021-05-01 DIAGNOSIS — W260XXA Contact with knife, initial encounter: Secondary | ICD-10-CM | POA: Insufficient documentation

## 2021-05-01 MED ORDER — LIDOCAINE-EPINEPHRINE-TETRACAINE (LET) TOPICAL GEL: 3.0000 mL | Freq: Once | TOPICAL | Status: DC

## 2021-05-01 MED ORDER — CEPHALEXIN 500 MG PO CAPS: 1000.0000 mg | ORAL_CAPSULE | Freq: Two times a day (BID) | ORAL | 0 refills | Status: AC

## 2021-05-01 MED ORDER — LIDOCAINE HCL (PF) 1 % IJ SOLN
10.0000 mL | Freq: Once | INTRAMUSCULAR | Status: AC
  Administered 2021-05-01: 10 mL
  Filled 2021-05-01: qty 10

## 2021-05-01 NOTE — ED Provider Notes (Signed)
[facility] Emergency Department Provider Note  ____________________________________________  Time seen: Approximately 4:50 PM  I have reviewed the triage vital signs and the nursing notes.   HISTORY  Chief Complaint Laceration    HPI [name] is a 45 y.o. female who presents the emergency department for a laceration to the left thumb.  Patient was using a knife to cut open a coconut when the knife slipped causing a laceration to the medial aspect of the thumb.  Bleeding was controlled direct pressure.  Patient denies any loss of range of motion.  Up-to-date on tetanus immunization.  No other injury or complaints at this time.         Past Medical History:  Diagnosis Date  . Allergy   . Daytime somnolence   . Fatigue   . GERD (gastroesophageal reflux disease)   . Hepatic hemangioma   . Snoring     There are no problems to display for this patient.   Past Surgical History:  Procedure Laterality Date  . ABDOMINAL HYSTERECTOMY    . CHOLECYSTECTOMY    . LAMINECTOMY AND MICRODISCECTOMY LUMBAR SPINE      Prior to Admission medications   Medication Sig Start Date End Date Taking? Authorizing Provider  cephALEXin (KEFLEX) 500 MG capsule Take 2 capsules (1,000 mg total) by mouth 2 (two) times daily. 05/01/21  Yes Annamae Shivley, Charline Bills, PA-C  albuterol (VENTOLIN HFA) 108 (90 Base) MCG/ACT inhaler INHALE 2 PUFFS EVERY 4 HOURS AS NEEDED FOR SHORTNESS OF BREATH, WHEEZING OR COUGH URGENCY 12/10/20 12/10/21  Hyatt, Lazarus Gowda, PA-C  azithromycin (ZITHROMAX) 250 MG tablet Take 1 tablet (250 mg total) by mouth daily. Take first 2 tablets together, then 1 every day until finished. 06/23/17   Vanessa Kick, MD  benzonatate (TESSALON) 100 MG capsule TAKE 1 CAPSULE BY MOUTH 3 TIMES DAILY AS NEEDED FOR COUGH 12/10/20 12/10/21  Hyatt, Lazarus Gowda, PA-C  cetirizine (ZYRTEC) 10 MG tablet Take 10 mg by mouth.    [provider]  HYDROcodone-homatropine  (HYCODAN) 5-1.5 MG/5ML syrup Take 5 mLs by mouth every 6 (six) hours as needed for cough. 06/23/17   Vanessa Kick, MD  Multiple Vitamin (MULTIVITAMIN) capsule Take 1 capsule by mouth daily.    [provider]  nitrofurantoin, macrocrystal-monohydrate, (MACROBID) 100 MG capsule TAKE 1 CAPSULE BY MOUTH 2 TIMES DAILY FOR 7 DAYS 12/31/20 12/31/21  Prince Solian, MD  Semaglutide, 1 MG/DOSE, 4 MG/3ML SOPN INJECT 1MG  UNDER THE SKIN ONCE A WEEK 07/24/20 07/24/21  Reginold Agent, NP    Allergies Patient has no known allergies.  Family History  Problem Relation Age of Onset  . Hyperlipidemia Mother   . Seizures Mother   . Hypothyroidism Mother   . Diabetes Father   . Hyperlipidemia Father   . Arthritis Father   . CAD Maternal Uncle   . CAD Paternal Uncle   . Alzheimer's disease Maternal Grandmother   . Diabetes Maternal Grandfather   . CAD Paternal Grandfather     Social History Social History   Tobacco Use  . Smoking status: Never Smoker  . Smokeless tobacco: Never Used  Substance Use Topics  . Alcohol use: Yes    Alcohol/week: 0.0 standard drinks  . Drug use: No     Review of Systems  Constitutional: No fever/chills Eyes: No visual changes. No discharge ENT: No upper respiratory complaints. Cardiovascular: no chest pain. Respiratory: no cough. No SOB. Gastrointestinal: No abdominal pain.  No nausea, no vomiting.  No diarrhea.  No constipation. Musculoskeletal: Positive for laceration to the left thumb. Skin: Negative for rash, abrasions, lacerations, ecchymosis. Neurological: Negative for headaches, focal weakness or numbness.  10 System ROS otherwise negative.  ____________________________________________   PHYSICAL EXAM:  VITAL SIGNS: ED Triage Vitals  Enc Vitals Group     BP 05/01/21 1532 117/74     Pulse Rate 05/01/21 1532 83     Resp 05/01/21 1532 18     Temp 05/01/21 1532 97.8 F (36.6 C)     Temp Source 05/01/21 1532 Oral     SpO2 05/01/21 1532  98 %     Weight 05/01/21 1533 192 lb (87.1 kg)     Height 05/01/21 1533 5\' 8"  (1.727 m)     Head Circumference --      Peak Flow --      Pain Score 05/01/21 1533 1     Pain Loc --      Pain Edu? --      Excl. in [place]? --      Constitutional: Alert and oriented. Well appearing and in no acute distress. Eyes: Conjunctivae are normal. PERRL. EOMI. Head: Atraumatic. ENT:      Ears:       Nose: No congestion/rhinnorhea.      Mouth/Throat: Mucous membranes are moist.  Neck: No stridor.    Cardiovascular: Normal rate, regular rhythm. Normal S1 and S2.  Good peripheral circulation. Respiratory: Normal respiratory effort without tachypnea or retractions. Lungs CTAB. Good air entry to the bases with no decreased or absent breath sounds. Musculoskeletal: Full range of motion to all extremities. No gross deformities appreciated.  Visualization of the left hand reveals a laceration along the medial aspect of the left thumb.  This measures approximately 3 cm in length.  Edges are smooth in nature.  No active bleeding at this time.  No visible foreign body.  Full range of motion intact to the digit with sensation and capillary refill intact. Neurologic:  Normal speech and language. No gross focal neurologic deficits are appreciated.  Skin:  Skin is warm, dry and intact. No rash noted. Psychiatric: Mood and affect are normal. Speech and behavior are normal. Patient exhibits appropriate insight and judgement.   ____________________________________________   LABS (all labs ordered are listed, but only abnormal results are displayed)  Labs Reviewed - No data to display ____________________________________________  EKG   ____________________________________________  RADIOLOGY   No results found.  ____________________________________________    PROCEDURES  Procedure(s) performed:    Marland KitchenMarland KitchenLaceration Repair  Date/Time: 05/01/2021 5:59 PM Performed by: Darletta Moll,  PA-C Authorized by: Darletta Moll, PA-C   Consent:    Consent obtained:  Verbal   Consent given by:  Patient   Risks, benefits, and alternatives were discussed: yes     Risks discussed:  Infection and pain Universal protocol:    Procedure explained and questions answered to patient or proxy's satisfaction: yes     Patient identity confirmed:  Verbally with patient Anesthesia:    Anesthesia method:  Local infiltration   Local anesthetic:  Lidocaine 1% w/o epi Laceration details:    Location:  Finger   Finger location:  L thumb   Length (cm):  3 Exploration:    Hemostasis achieved with:  Direct pressure   Imaging outcome: foreign body not noted     Wound exploration: wound explored through full range of motion and entire depth of wound visualized     Wound extent: no foreign bodies/material noted, no muscle damage  noted, no nerve damage noted, no tendon damage noted, no underlying fracture noted and no vascular damage noted     Contaminated: no   Treatment:    Area cleansed with:  Povidone-iodine and saline   Amount of cleaning:  Extensive   Irrigation solution:  Sterile saline   Irrigation volume:  500 ml   Irrigation method:  Syringe Skin repair:    Repair method:  Sutures   Suture size:  4-0   Suture material:  Nylon   Suture technique:  Simple interrupted   Number of sutures:  4 Approximation:    Approximation:  Close Repair type:    Repair type:  Simple Post-procedure details:    Dressing:  Open (no dressing)   Procedure completion:  Tolerated well, no immediate complications      Medications  lidocaine-EPINEPHrine-tetracaine (LET) topical gel (3 mLs Topical Not Given 05/01/21 1712)  lidocaine (PF) (XYLOCAINE) 1 % injection 10 mL (10 mLs Infiltration Given by Other 05/01/21 1712)     ____________________________________________   INITIAL IMPRESSION / ASSESSMENT AND PLAN / ED COURSE  Pertinent labs & imaging results that were available during my care  of the patient were reviewed by me and considered in my medical decision making (see chart for details).  Review of the [place] CSRS was performed in accordance of the [place] prior to dispensing any controlled drugs.           Patient's diagnosis is consistent with thumb laceration.  Patient presented to emergency department after sustaining a laceration to the left thumb.  Laceration was closed as described above with no complications.  Wound care instructions discussed with the patient.  Return precautions also discussed with the patient at this time.  Patient was placed on antibiotics prophylactically.  Tylenol Motrin at home for any pain..  Follow-up primary care in 1 week for suture removal.  Patient is given ED precautions to return to the ED for any worsening or new symptoms.     ____________________________________________  FINAL CLINICAL IMPRESSION(S) / ED DIAGNOSES  Final diagnoses:  Laceration of left thumb without foreign body without damage to nail, initial encounter      NEW MEDICATIONS STARTED DURING THIS VISIT:  ED Discharge Orders         Ordered    cephALEXin (KEFLEX) 500 MG capsule  2 times daily        05/01/21 1801              This chart was dictated using voice recognition software/Dragon. Despite best efforts to proofread, errors can occur which can change the meaning. Any change was purely unintentional.    Darletta Moll, PA-C 05/01/21 1802    Vladimir Crofts, MD 05/01/21 [phone]

## 2021-05-01 NOTE — ED Triage Notes (Signed)
Pt with laceration with kitchen knife by accident around 1500 to left thumb. 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 Pt with hand bandaged, bleeding controlled. Pt with arm elevated. Pt alert and oriented.

## 2021-05-02 ENCOUNTER — Other Ambulatory Visit (HOSPITAL_COMMUNITY): Payer: Self-pay

## 2021-05-02 MED ORDER — CEPHALEXIN 500 MG PO CAPS
1000.0000 mg | ORAL_CAPSULE | Freq: Two times a day (BID) | ORAL | 0 refills | Status: AC
  Filled 2021-05-02: qty 28, 7d supply, fill #0

## 2021-05-07 ENCOUNTER — Other Ambulatory Visit: Payer: Self-pay | Admitting: Internal Medicine

## 2021-05-07 DIAGNOSIS — Z8249 Family history of ischemic heart disease and other diseases of the circulatory system: Secondary | ICD-10-CM

## 2021-05-12 ENCOUNTER — Other Ambulatory Visit (HOSPITAL_COMMUNITY): Payer: Self-pay

## 2021-05-26 ENCOUNTER — Ambulatory Visit
Admission: RE | Admit: 2021-05-26 | Discharge: 2021-05-26 | Disposition: A | Discharge status: Home / Self Care | Payer: No Typology Code available for payment source | Source: Ambulatory Visit | Attending: Internal Medicine | Admitting: Internal Medicine

## 2021-05-26 DIAGNOSIS — Z8249 Family history of ischemic heart disease and other diseases of the circulatory system: Secondary | ICD-10-CM

## 2021-06-28 ENCOUNTER — Other Ambulatory Visit (HOSPITAL_COMMUNITY): Payer: Self-pay

## 2021-06-30 ENCOUNTER — Other Ambulatory Visit (HOSPITAL_COMMUNITY): Payer: Self-pay

## 2021-07-02 ENCOUNTER — Other Ambulatory Visit (HOSPITAL_COMMUNITY): Payer: Self-pay

## 2021-07-03 ENCOUNTER — Other Ambulatory Visit (HOSPITAL_COMMUNITY): Payer: Self-pay

## 2021-07-04 ENCOUNTER — Other Ambulatory Visit (HOSPITAL_COMMUNITY): Payer: Self-pay

## 2021-07-04 MED ORDER — SEMAGLUTIDE (1 MG/DOSE) 4 MG/3ML ~~LOC~~ SOPN
PEN_INJECTOR | SUBCUTANEOUS | 1 refills | Status: DC
  Filled 2021-07-04: qty 3, 28d supply, fill #0
  Filled 2021-07-30: qty 3, 28d supply, fill #1
  Filled 2021-08-26: qty 3, 28d supply, fill #2

## 2021-07-07 ENCOUNTER — Other Ambulatory Visit (HOSPITAL_COMMUNITY): Payer: Self-pay

## 2021-07-08 ENCOUNTER — Other Ambulatory Visit (HOSPITAL_COMMUNITY): Payer: Self-pay

## 2021-07-15 ENCOUNTER — Other Ambulatory Visit: Payer: Self-pay | Admitting: Adult Health

## 2021-07-15 DIAGNOSIS — E041 Nontoxic single thyroid nodule: Secondary | ICD-10-CM

## 2021-07-16 ENCOUNTER — Ambulatory Visit
Admission: RE | Admit: 2021-07-16 | Discharge: 2021-07-16 | Disposition: A | Discharge status: Home / Self Care | Payer: No Typology Code available for payment source | Source: Ambulatory Visit | Attending: Adult Health | Admitting: Adult Health

## 2021-07-16 DIAGNOSIS — E041 Nontoxic single thyroid nodule: Secondary | ICD-10-CM

## 2021-07-18 ENCOUNTER — Other Ambulatory Visit: Payer: Self-pay | Admitting: Internal Medicine

## 2021-07-18 DIAGNOSIS — E041 Nontoxic single thyroid nodule: Secondary | ICD-10-CM

## 2021-07-22 ENCOUNTER — Other Ambulatory Visit: Payer: Self-pay | Admitting: Obstetrics & Gynecology

## 2021-07-22 DIAGNOSIS — Z1231 Encounter for screening mammogram for malignant neoplasm of breast: Secondary | ICD-10-CM

## 2021-07-26 ENCOUNTER — Other Ambulatory Visit: Payer: Self-pay

## 2021-07-26 ENCOUNTER — Ambulatory Visit
Admission: RE | Admit: 2021-07-26 | Discharge: 2021-07-26 | Disposition: A | Discharge status: Home / Self Care | Payer: No Typology Code available for payment source | Source: Ambulatory Visit

## 2021-07-26 DIAGNOSIS — Z1231 Encounter for screening mammogram for malignant neoplasm of breast: Secondary | ICD-10-CM

## 2021-07-30 ENCOUNTER — Other Ambulatory Visit (HOSPITAL_COMMUNITY): Payer: Self-pay

## 2021-08-19 ENCOUNTER — Other Ambulatory Visit (HOSPITAL_COMMUNITY)
Admission: RE | Admit: 2021-08-19 | Discharge: 2021-08-19 | Disposition: A | Discharge status: Home / Self Care | Payer: No Typology Code available for payment source | Source: Ambulatory Visit | Attending: Internal Medicine | Admitting: Internal Medicine

## 2021-08-19 ENCOUNTER — Ambulatory Visit
Admission: RE | Admit: 2021-08-19 | Discharge: 2021-08-19 | Disposition: A | Discharge status: Home / Self Care | Payer: No Typology Code available for payment source | Source: Ambulatory Visit | Attending: Internal Medicine | Admitting: Internal Medicine

## 2021-08-19 DIAGNOSIS — E061 Subacute thyroiditis: Secondary | ICD-10-CM | POA: Insufficient documentation

## 2021-08-19 DIAGNOSIS — E041 Nontoxic single thyroid nodule: Secondary | ICD-10-CM

## 2021-08-22 LAB — CYTOLOGY - NON PAP

## 2021-08-26 ENCOUNTER — Other Ambulatory Visit (HOSPITAL_COMMUNITY): Payer: Self-pay

## 2021-08-26 MED ORDER — OZEMPIC (1 MG/DOSE) 4 MG/3ML ~~LOC~~ SOPN
1.0000 mg | PEN_INJECTOR | SUBCUTANEOUS | 0 refills | Status: DC
  Filled 2021-08-26: qty 3, 28d supply, fill #0
  Filled 2021-09-30: qty 3, 28d supply, fill #1
  Filled 2021-10-27: qty 3, 28d supply, fill #2

## 2021-09-30 ENCOUNTER — Other Ambulatory Visit (HOSPITAL_COMMUNITY): Payer: Self-pay

## 2021-10-27 ENCOUNTER — Other Ambulatory Visit (HOSPITAL_COMMUNITY): Payer: Self-pay

## 2021-11-01 IMAGING — CT CT CARDIAC CORONARY ARTERY CALCIUM SCORE
3 series · 12 of 20 positions shown, 14 images · non-contrast
Comparison: MRI of the abdomen on 04/17/2013

CLINICAL DATA: 44-year-old Caucasian female with history
hyperlipidemia and family history of heart disease.

EXAM:
CT CARDIAC CORONARY ARTERY CALCIUM SCORE
TECHNIQUE: Non-contrast imaging through the heart was performed using
prospective ECG gating. Image post processing was performed on an
independent workstation, allowing for quantitative analysis of the
heart and coronary arteries. Note that this exam targets the heart
and the chest was not imaged in its entirety.

[Series 2: calcium scoring 2.00 qr36 bestdiast 70% hrt calciu · axial · 0.32mm/px · z∈[+1800,+1824]mm · 2 of 61 slices shown]
[im 13/61  vessel]
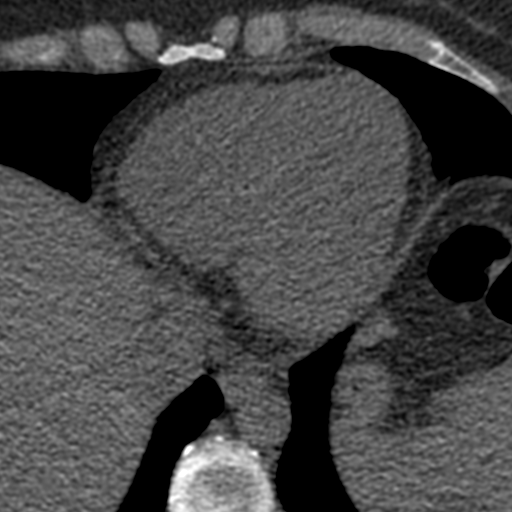
[im 25/61  vessel]
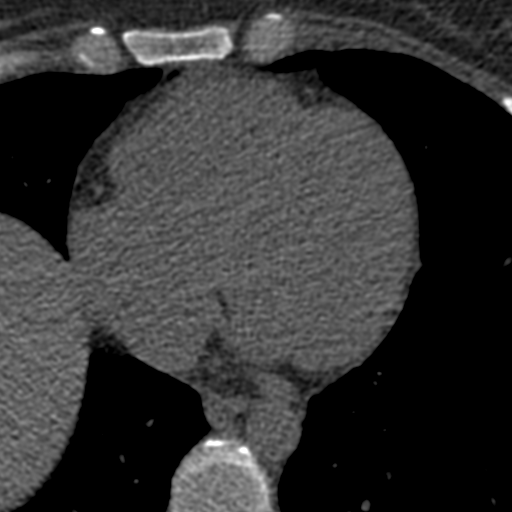

[Series 3: calcium scoring 2.00 br40 bestdiast 70% axial · axial · 0.51mm/px · z∈[+1796,+1876]mm · 5 of 61 slices shown, 7 images]
[im 11/61  vessel]
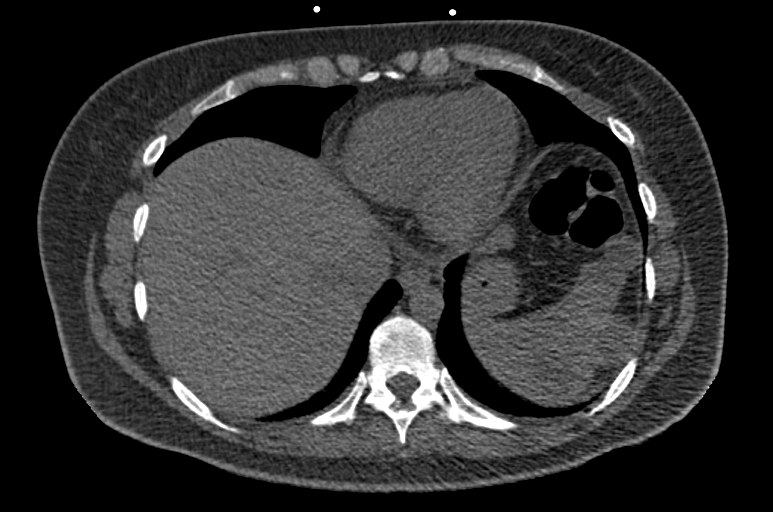
[im 11/61  lung]
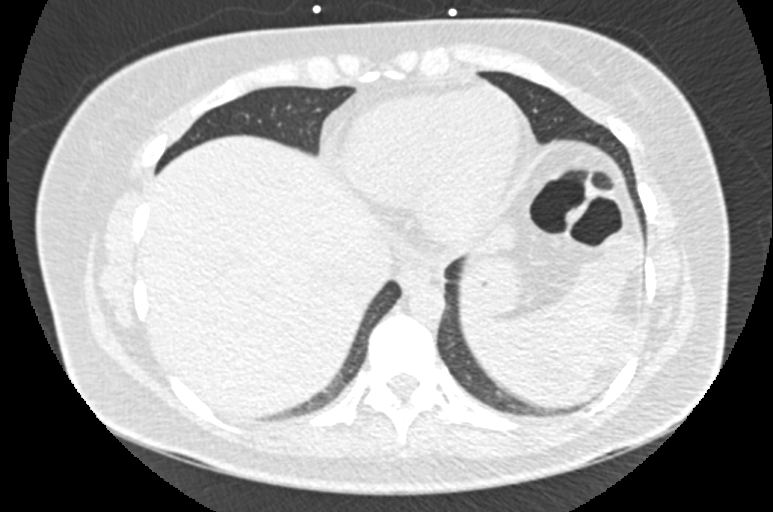
[im 21/61  vessel]
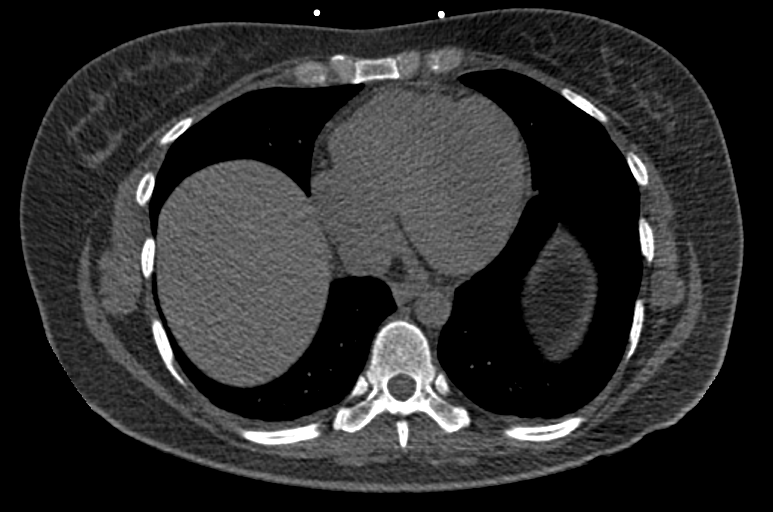
[im 31/61  vessel]
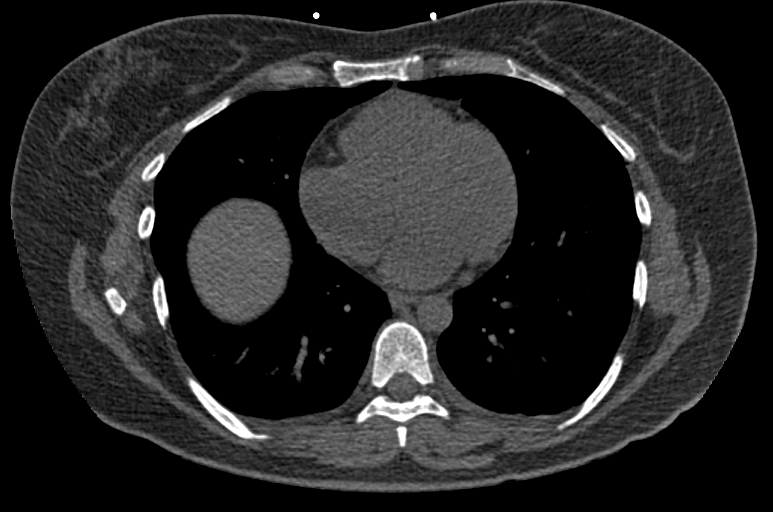
[im 41/61  vessel]
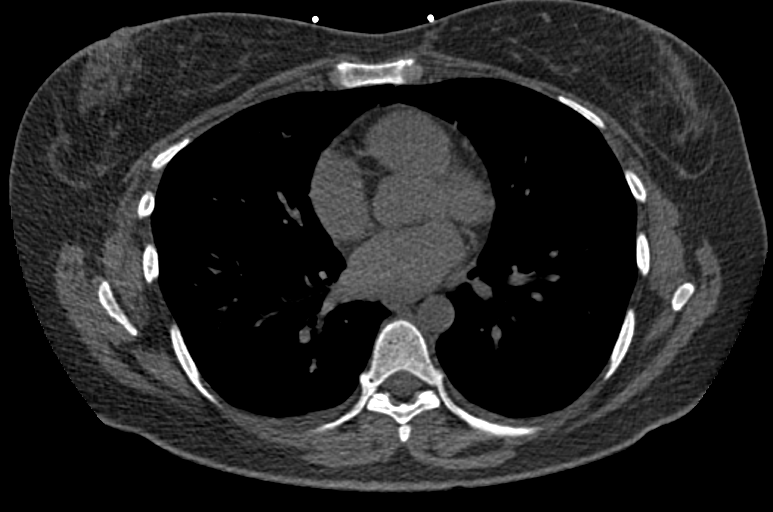
[im 51/61  vessel]
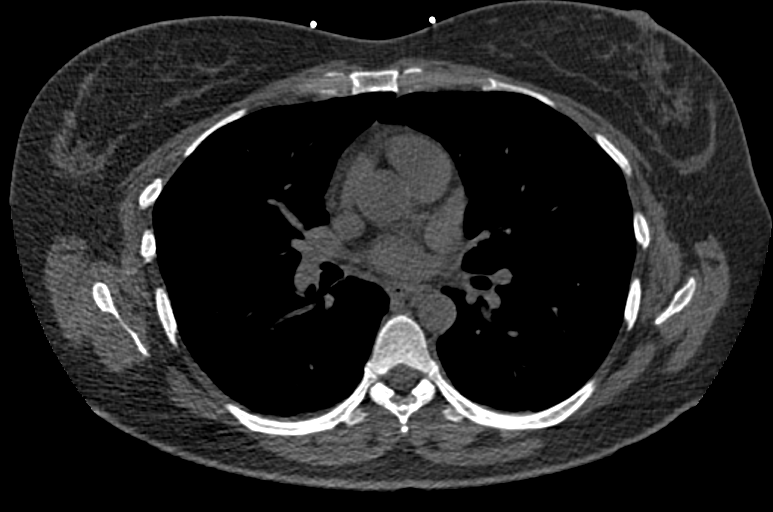
[im 51/61  lung]
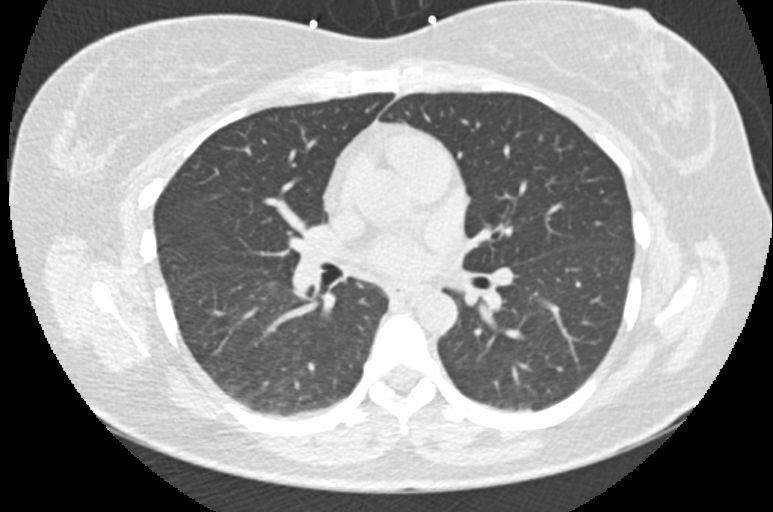

[Series 9: calcium scoring 2.00 br60 bestdiast 70% lungs · axial · 0.51mm/px · z∈[+1796,+1876]mm · 5 of 61 slices shown]
[im 11/61  vessel]
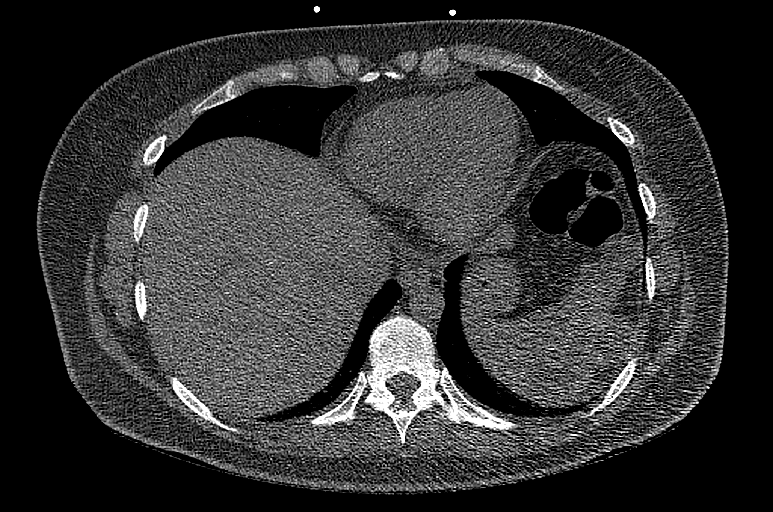
[im 21/61  vessel]
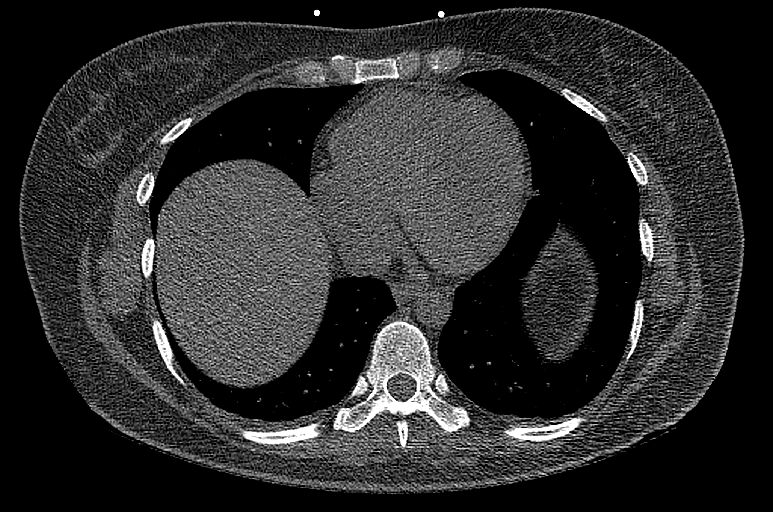
[im 31/61  vessel]
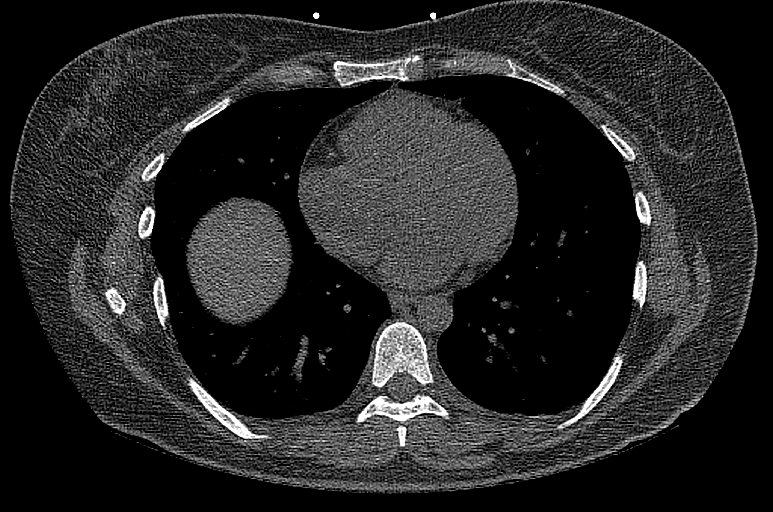
[im 41/61  vessel]
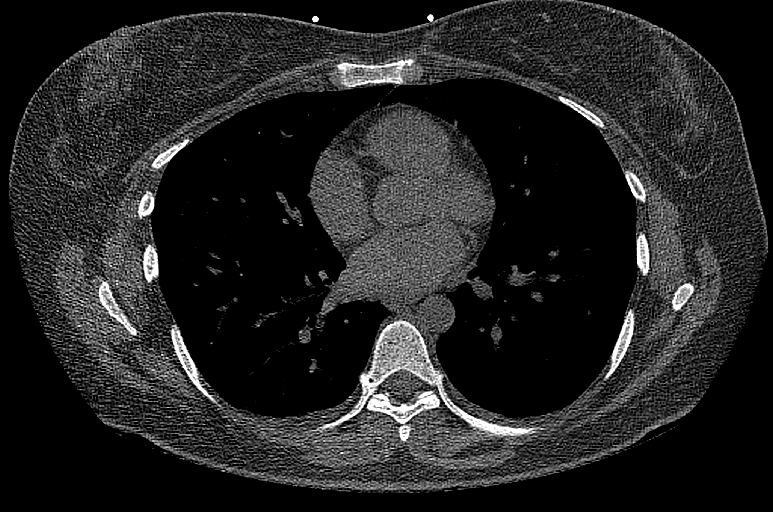
[im 51/61  vessel]
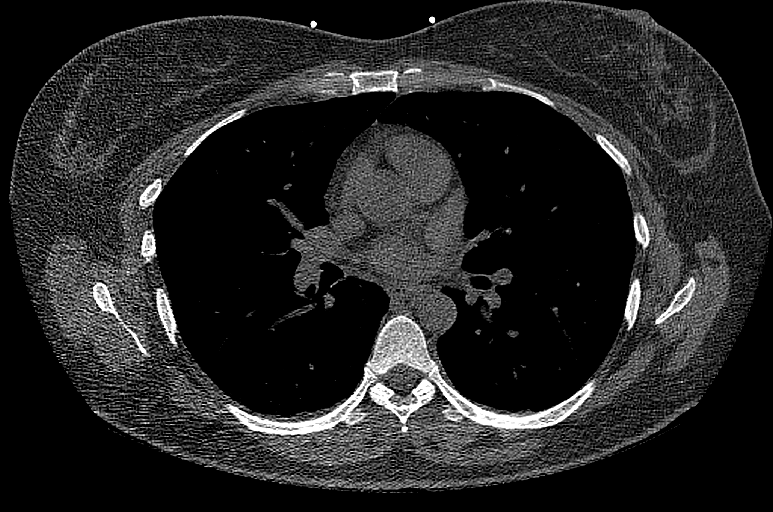

[12 of 20 positions shown; findings below may reference images not displayed]

FINDINGS: CORONARY CALCIUM SCORES:

Left Main: 0

LAD: 0

LCx: 0

RCA: 0

Total Agatston Score: 0

[HOSPITAL] percentile: 0

AORTA MEASUREMENTS:

Ascending Aorta: 26 mm

Descending Aorta: 19 mm

OTHER FINDINGS:

The heart size is within normal limits. No pericardial fluid
identified. Visualized thoracic aorta and central pulmonary arteries
are normal in caliber. Visualized mediastinum and hilar regions
demonstrate no lymphadenopathy or masses. Visualized lungs show no
evidence of pulmonary edema, consolidation, pneumothorax, nodule or
pleural fluid. Vague hypodense region in the posterior right lobe of
the liver corresponds to a known hemangioma of the liver which was
previously imaged by MRI. Visualized bony structures are
unremarkable.
IMPRESSION: Coronary calcium score of 0. Area of hypodensity in the liver
corresponds to a known hemangioma.

## 2021-11-05 ENCOUNTER — Other Ambulatory Visit (HOSPITAL_COMMUNITY): Payer: Self-pay

## 2021-11-05 MED ORDER — OZEMPIC (2 MG/DOSE) 8 MG/3ML ~~LOC~~ SOPN
PEN_INJECTOR | SUBCUTANEOUS | 5 refills | Status: AC
  Filled 2021-11-05: qty 3, 30d supply, fill #0
  Filled 2021-12-24: qty 3, 30d supply, fill #1
  Filled 2022-01-19: qty 3, 30d supply, fill #2

## 2021-11-06 ENCOUNTER — Other Ambulatory Visit (HOSPITAL_COMMUNITY): Payer: Self-pay

## 2021-11-24 ENCOUNTER — Ambulatory Visit: Admit: 2021-11-24 | Discharge status: Absent without leave | Payer: No Typology Code available for payment source

## 2021-11-25 ENCOUNTER — Other Ambulatory Visit (HOSPITAL_COMMUNITY): Payer: Self-pay

## 2021-11-25 MED ORDER — PAXLOVID (300/100) 20 X 150 MG & 10 X 100MG PO TBPK
ORAL_TABLET | ORAL | 0 refills | Status: AC
  Filled 2021-11-25: qty 30, 5d supply, fill #0

## 2021-11-28 ENCOUNTER — Other Ambulatory Visit (HOSPITAL_COMMUNITY): Payer: Self-pay

## 2021-11-28 MED ORDER — CLENPIQ 10-3.5-12 MG-GM -GM/160ML PO SOLN
ORAL | 0 refills | Status: AC
  Filled 2021-11-28: qty 320, 1d supply, fill #0

## 2021-12-01 ENCOUNTER — Other Ambulatory Visit (HOSPITAL_COMMUNITY): Payer: Self-pay

## 2021-12-15 ENCOUNTER — Other Ambulatory Visit (HOSPITAL_COMMUNITY): Payer: Self-pay

## 2021-12-15 MED ORDER — AZITHROMYCIN 250 MG PO TABS
ORAL_TABLET | ORAL | 0 refills | Status: AC
  Filled 2021-12-15: qty 6, 5d supply, fill #0

## 2021-12-18 ENCOUNTER — Other Ambulatory Visit (HOSPITAL_COMMUNITY): Payer: Self-pay

## 2021-12-24 ENCOUNTER — Other Ambulatory Visit (HOSPITAL_COMMUNITY): Payer: Self-pay

## 2022-01-19 ENCOUNTER — Other Ambulatory Visit (HOSPITAL_COMMUNITY): Payer: Self-pay

## 2022-01-20 ENCOUNTER — Other Ambulatory Visit (HOSPITAL_COMMUNITY): Payer: Self-pay

## 2022-02-13 ENCOUNTER — Other Ambulatory Visit (HOSPITAL_COMMUNITY): Payer: Self-pay

## 2022-12-01 ENCOUNTER — Other Ambulatory Visit (HOSPITAL_COMMUNITY): Payer: Self-pay

## 2022-12-01 MED ORDER — WEGOVY 2.4 MG/0.75ML ~~LOC~~ SOAJ
2.4000 mg | SUBCUTANEOUS | 5 refills | Status: AC
  Filled 2022-12-01: qty 3, 28d supply, fill #0
  Filled 2022-12-28: qty 3, 28d supply, fill #1
  Filled 2023-01-24: qty 3, 28d supply, fill #2
  Filled 2023-02-21: qty 3, 28d supply, fill #3
  Filled 2023-04-15 – 2023-04-25 (×2): qty 3, 28d supply, fill #4

## 2022-12-03 ENCOUNTER — Other Ambulatory Visit (HOSPITAL_COMMUNITY): Payer: Self-pay

## 2023-04-15 ENCOUNTER — Other Ambulatory Visit: Payer: Self-pay

## 2023-04-27 ENCOUNTER — Other Ambulatory Visit (HOSPITAL_COMMUNITY): Payer: Self-pay

## 2023-04-28 ENCOUNTER — Other Ambulatory Visit (HOSPITAL_COMMUNITY): Payer: Self-pay

## 2023-08-04 ENCOUNTER — Other Ambulatory Visit (HOSPITAL_COMMUNITY): Payer: Self-pay

## 2023-08-17 ENCOUNTER — Other Ambulatory Visit (HOSPITAL_COMMUNITY): Payer: Self-pay

## 2023-08-17 MED ORDER — PAXLOVID (300/100) 20 X 150 MG & 10 X 100MG PO TBPK
3.0000 | ORAL_TABLET | Freq: Two times a day (BID) | ORAL | 0 refills | Status: AC
  Filled 2023-08-17: qty 30, 5d supply, fill #0

## 2024-03-20 ENCOUNTER — Other Ambulatory Visit: Payer: Self-pay | Admitting: Internal Medicine

## 2024-03-20 DIAGNOSIS — E041 Nontoxic single thyroid nodule: Secondary | ICD-10-CM

## 2024-03-29 ENCOUNTER — Ambulatory Visit
Admission: RE | Admit: 2024-03-29 | Discharge: 2024-03-29 | Disposition: A | Discharge status: Home / Self Care | Source: Ambulatory Visit | Attending: Internal Medicine | Admitting: Internal Medicine

## 2024-03-29 DIAGNOSIS — E041 Nontoxic single thyroid nodule: Secondary | ICD-10-CM

## 2025-01-05 ENCOUNTER — Other Ambulatory Visit (HOSPITAL_COMMUNITY): Payer: Self-pay

## 2025-01-05 MED ORDER — IRON (FERROUS SULFATE) 325 (65 FE) MG PO TABS
1.0000 | ORAL_TABLET | Freq: Every day | ORAL | 1 refills | Status: AC
  Filled 2025-01-05: qty 30, 30d supply, fill #0
